# Patient Record
Sex: Female | Born: 1980 | Race: White | Hispanic: No | Marital: Married | Smoking: Current every day smoker
Health system: Southern US, Community
[De-identification: ages and names within clinical notes are randomized; demographics above are authoritative.]

## PROBLEM LIST (undated history)

## (undated) DIAGNOSIS — K219 Gastro-esophageal reflux disease without esophagitis: Secondary | ICD-10-CM

## (undated) DIAGNOSIS — Z8489 Family history of other specified conditions: Secondary | ICD-10-CM

## (undated) DIAGNOSIS — Q796 Ehlers-Danlos syndrome, unspecified: Secondary | ICD-10-CM

## (undated) DIAGNOSIS — R519 Headache, unspecified: Secondary | ICD-10-CM

## (undated) DIAGNOSIS — F32A Depression, unspecified: Secondary | ICD-10-CM

## (undated) DIAGNOSIS — F329 Major depressive disorder, single episode, unspecified: Secondary | ICD-10-CM

## (undated) DIAGNOSIS — M1990 Unspecified osteoarthritis, unspecified site: Secondary | ICD-10-CM

## (undated) DIAGNOSIS — F419 Anxiety disorder, unspecified: Secondary | ICD-10-CM

## (undated) DIAGNOSIS — F319 Bipolar disorder, unspecified: Secondary | ICD-10-CM

## (undated) HISTORY — PX: EYE SURGERY: SHX253

## (undated) HISTORY — PX: OTHER SURGICAL HISTORY: SHX169

## (undated) HISTORY — PX: BILATERAL SALPINGECTOMY: SHX5743

## (undated) HISTORY — PX: BUNIONECTOMY: SHX129

## (undated) HISTORY — PX: ANKLE ARTHROSCOPY W/ OPEN REPAIR: SHX1145

## (undated) HISTORY — PX: WISDOM TOOTH EXTRACTION: SHX21

## (undated) HISTORY — PX: BREAST ENHANCEMENT SURGERY: SHX7

## (undated) HISTORY — PX: BACK SURGERY: SHX140

---
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
## 2001-11-21 ENCOUNTER — Encounter: Admission: RE | Admit: 2001-11-21 | Discharge: 2001-11-21 | Payer: Self-pay | Admitting: *Deleted

## 2001-11-23 ENCOUNTER — Encounter: Admission: RE | Admit: 2001-11-23 | Discharge: 2001-11-23 | Payer: Self-pay | Admitting: Internal Medicine

## 2001-11-27 ENCOUNTER — Encounter: Admission: RE | Admit: 2001-11-27 | Discharge: 2001-11-27 | Payer: Self-pay | Admitting: *Deleted

## 2001-11-28 ENCOUNTER — Other Ambulatory Visit: Admission: RE | Admit: 2001-11-28 | Discharge: 2001-11-28 | Payer: Self-pay | Admitting: Obstetrics & Gynecology

## 2001-11-28 ENCOUNTER — Encounter: Admission: RE | Admit: 2001-11-28 | Discharge: 2001-11-28 | Payer: Self-pay | Admitting: *Deleted

## 2001-11-28 ENCOUNTER — Encounter (INDEPENDENT_AMBULATORY_CARE_PROVIDER_SITE_OTHER): Payer: Self-pay | Admitting: Specialist

## 2009-03-13 ENCOUNTER — Ambulatory Visit (HOSPITAL_COMMUNITY): Admission: RE | Admit: 2009-03-13 | Discharge: 2009-03-13 | Payer: Self-pay | Admitting: Orthopedic Surgery

## 2009-10-01 ENCOUNTER — Encounter: Admission: RE | Admit: 2009-10-01 | Discharge: 2009-10-01 | Payer: Self-pay

## 2019-11-18 ENCOUNTER — Other Ambulatory Visit: Payer: Self-pay

## 2019-11-18 ENCOUNTER — Emergency Department (HOSPITAL_COMMUNITY)

## 2019-11-18 ENCOUNTER — Emergency Department (HOSPITAL_COMMUNITY)
Admission: EM | Admit: 2019-11-18 | Discharge: 2019-11-19 | Disposition: A | Discharge status: Home / Self Care | Attending: Emergency Medicine | Admitting: Emergency Medicine

## 2019-11-18 DIAGNOSIS — W19XXXA Unspecified fall, initial encounter: Secondary | ICD-10-CM

## 2019-11-18 DIAGNOSIS — M25552 Pain in left hip: Secondary | ICD-10-CM | POA: Diagnosis present

## 2019-11-18 DIAGNOSIS — W1830XA Fall on same level, unspecified, initial encounter: Secondary | ICD-10-CM | POA: Diagnosis not present

## 2019-11-18 DIAGNOSIS — G8921 Chronic pain due to trauma: Secondary | ICD-10-CM | POA: Insufficient documentation

## 2019-11-18 DIAGNOSIS — G8929 Other chronic pain: Secondary | ICD-10-CM

## 2019-11-18 DIAGNOSIS — Z79899 Other long term (current) drug therapy: Secondary | ICD-10-CM | POA: Insufficient documentation

## 2019-11-18 DIAGNOSIS — M5442 Lumbago with sciatica, left side: Secondary | ICD-10-CM | POA: Diagnosis not present

## 2019-11-18 LAB — I-STAT BETA HCG BLOOD, ED (MC, WL, AP ONLY): I-stat hCG, quantitative: <5 m[IU]/mL (ref <5)

## 2019-11-18 MED ORDER — ONDANSETRON HCL 4 MG/2ML IJ SOLN
4.0000 mg | Freq: Once | INTRAMUSCULAR | Status: AC
  Administered 2019-11-18: 4 mg via INTRAVENOUS
  Filled 2019-11-18: qty 2

## 2019-11-18 MED ORDER — SODIUM CHLORIDE 0.9 % IV BOLUS
1000.0000 mL | Freq: Once | INTRAVENOUS | Status: AC
  Administered 2019-11-19: 1000 mL via INTRAVENOUS

## 2019-11-18 MED ORDER — HYDROMORPHONE HCL 1 MG/ML IJ SOLN
1.0000 mg | Freq: Once | INTRAMUSCULAR | Status: AC
  Administered 2019-11-18: 1 mg via INTRAVENOUS
  Filled 2019-11-18: qty 1

## 2019-11-18 MED ORDER — LORAZEPAM 2 MG/ML IJ SOLN
1.0000 mg | Freq: Once | INTRAMUSCULAR | Status: AC
  Administered 2019-11-18: 1 mg via INTRAVENOUS
  Filled 2019-11-18: qty 1

## 2019-11-18 NOTE — ED Triage Notes (Signed)
Patient c/o left hip pain from fall two weeks ago. Goes to a pain clinic and did an xray/MRI of her back, but states she is still having excruciating pain in her left hip.

## 2019-11-18 NOTE — ED Provider Notes (Signed)
[facility] EMERGENCY DEPARTMENT Provider Note   CSN: [id number] Arrival date & time: 11/18/19  1855    History Chief Complaint  Patient presents with  . Hip Pain    Terri Patterson is a 39 y.o. female with medical history significant for chronic back pain who presents for evaluation of left hip pain.  She has sciatica.  Had fall 2 weeks ago and since then has had left hip pain.  She was seen by her pain management who prescribed her oxycodone, gabapentin as well as "pain patches."  Rates her pain a 10/10.  She feels like she has numbness to her left lower extremity.  Had MRI of her back on Friday at [facility] which was "negative".  I do not see records of this in epic.  Was also seen at [facility] in [place] on 4/17 with similar symptoms.  Had negative plain films of her lumbar and left hip.  Patient states her pain is worsening.  She denies fever, chills, nausea, vomiting, chest pain, shortness of breath abdominal pain, pelvic pain, flank pain, vaginal discharge, bowel or bladder incontinence, saddle paresthesias.  Denies any redness, swelling, warmth to extremities.  Denies additional aggravating or alleviating factors.  Apparently she is followed with her pain clinic and they told her they would not prescribe her any additional pain medicine.  Is also given course of steroids which have not helped.  Have a nerve conduction test outpatient which patient states was "normal."  Does have chronic back pain however this does not feel similar.  Denies any urinary symptoms or weakness.  Denies additional aggravating or alleviating factors.  History obtained from patient and past medical records.  No interpreter is used.  HPI     No past medical history on file.  There are no problems to display for this patient.  History reviewed.   OB History   No obstetric history on file.     No family history on file.  Social History   Tobacco Use  . Smoking status: Not  on file  Substance Use Topics  . Alcohol use: Not on file  . Drug use: Not on file    Home Medications Prior to Admission medications   Medication Sig Start Date End Date Taking? Authorizing Provider  acetaminophen (TYLENOL) 500 MG tablet Take 2,000 mg by mouth daily as needed (pain).   Yes [provider]  Buprenorphine 7.5 MCG/HR PTWK Apply 1 patch topically every Friday. 11/14/19  Yes [provider]  busPIRone (BUSPAR) 10 MG tablet Take 20 mg by mouth 3 (three) times daily. 10/08/19  Yes [provider]  diphenhydrAMINE (BENADRYL) 25 MG tablet Take 25-50 mg by mouth See admin instructions. Take 1 tablet (25 mg) by mouth every morning and 2 tablets (50 mg) at night   Yes [provider]  esomeprazole (NEXIUM) 20 MG capsule Take 40 mg by mouth daily as needed (acid reflux/indigestion).   Yes [provider]  lamoTRIgine (LAMICTAL) 25 MG tablet Take 125 mg by mouth at bedtime. 05/31/19  Yes [provider]  naproxen sodium (ALEVE) 220 MG tablet Take 440 mg by mouth 2 (two) times daily as needed (pain).   Yes [provider]  oxyCODONE-acetaminophen (PERCOCET/ROXICET) 5-325 MG tablet Take 1 tablet by mouth every 6 (six) hours as needed (pain).  10/24/19   [provider]  predniSONE (DELTASONE) 20 MG tablet Take 20 mg by mouth 2 (two) times daily. 11/11/19   [provider]  Allergies    Penicillins  Review of Systems   Review of Systems  Constitutional: Negative.   HENT: Negative.   Respiratory: Negative.   Cardiovascular: Negative.   Gastrointestinal: Negative.   Genitourinary: Negative.  Negative for difficulty urinating.  Musculoskeletal: Positive for gait problem. Negative for arthralgias, neck pain and neck stiffness.       Left hip pain  Skin: Negative.   All other systems reviewed and are negative.   Physical Exam Updated Vital Signs BP (!) 97/39   Pulse (!) 110   Temp 98.5 F (36.9 C)  (Oral)   Resp (!) 24   SpO2 97%   Physical Exam Vitals and nursing note reviewed.  Constitutional:      General: She is not in acute distress.    Appearance: She is well-developed. She is not ill-appearing, toxic-appearing or diaphoretic.     Comments: Rocking back and forth in pain, screaming  HENT:     Head: Normocephalic and atraumatic.     Nose: Nose normal.     Mouth/Throat:     Mouth: Mucous membranes are moist.     Pharynx: Oropharynx is clear.  Eyes:     Pupils: Pupils are equal, round, and reactive to light.  Cardiovascular:     Rate and Rhythm: Tachycardia present.     Pulses: Normal pulses.     Heart sounds: Normal heart sounds.  Pulmonary:     Effort: Pulmonary effort is normal. No respiratory distress.     Breath sounds: Normal breath sounds.  Abdominal:     General: Bowel sounds are normal. There is no distension.     Tenderness: There is no abdominal tenderness. There is no right CVA tenderness, left CVA tenderness, guarding or rebound.  Musculoskeletal:        General: Tenderness present. No swelling, deformity or signs of injury.     Cervical back: Normal and normal range of motion.     Thoracic back: Normal.     Lumbar back: Tenderness present.       Back:     Right hip: Normal.     Left hip: Tenderness present. No deformity or lacerations. Decreased range of motion.     Right upper leg: Normal.     Left upper leg: Normal.     Right knee: Normal.     Left knee: Normal.     Right lower leg: Normal. No edema.     Left lower leg: Normal. No edema.     Right ankle: Normal.     Left ankle: Normal.       Legs:     Comments: Pelvis stable, nontender palpation.  Diffuse tenderness to her left hip however worse to her piriformis.  Patient unwilling to perform range of motion due to pain.  No edema, erythema or warmth.  No obvious swelling.  No shortening or rotation of legs.  Skin:    General: Skin is warm and dry.  Neurological:     Mental Status: She is  alert.     Comments: Subjective numbness to her left lateral thigh.  Will strengthen bilateral feet.     ED Results / Procedures / Treatments   Labs (all labs ordered are listed, but only abnormal results are displayed) Labs Reviewed  I-STAT BETA HCG BLOOD, ED ([facility], [facility], [facility] ONLY)    EKG None  Radiology DG Hip Unilat With Pelvis 2-3 Views Left  Result Date: 11/18/2019 CLINICAL DATA:  Left hip pain from fall 2  weeks ago EXAM: DG HIP (WITH OR WITHOUT PELVIS) 2-3V LEFT COMPARISON:  None. FINDINGS: Frontal view of the pelvis as well as frontal and frogleg lateral views of the left hip are obtained. No fracture, subluxation, or dislocation. Joint spaces are well preserved. Soft tissues are normal. IMPRESSION: 1. No acute bony abnormality. Electronically Signed   By: Sharlet Salina M.D.   On: 11/18/2019 20:30   Acute Interface, Incoming Rad Results - 11/10/2019 6:36 PM EDT  INDICATION: Sciatica  COMPARISON: None  TECHNIQUE: 3 views lumbar spine  FINDINGS: 5 lumbar type nonrib and vertebral bodies. Mild degenerative disc disease at L5-S1. No acute fractures or subluxations. No compression deformities. Mild facet degenerative changes at L5-S1. Unremarkable paraspinal soft tissues.   IMPRESSION:  Mild degenerative changes at L5-S1. No acute process.   Procedures Procedures (including critical care time)  Medications Ordered in ED Medications  sodium chloride 0.9 % bolus 1,000 mL (has no administration in time range)  HYDROmorphone (DILAUDID) injection 1 mg (1 mg Intravenous Given 11/18/19 2209)  ondansetron (ZOFRAN) injection 4 mg (4 mg Intravenous Given 11/18/19 2205)  LORazepam (ATIVAN) injection 1 mg (1 mg Intravenous Given 11/18/19 2206)   ED Course  I have reviewed the triage vital signs and the nursing notes.  Pertinent labs & imaging results that were available during my care of the patient were reviewed by me and considered in my medical decision making (see chart for  details).  65 old female presents for evaluation of left hip pain.  She is afebrile, nonseptic, not ill-appearing.  Is being followed by pain management for this.  Had negative plain film x-ray, lumbar as well as MRI on Friday which did not show any significant findings.  I am unable to view her MRI records.  She has been provided with pain management outpatient when she called her pain management provider these told her they were unable to provide her additional medicine.  Patient without any infectious symptoms.  She does have severe tenderness to her left piriformis and hip.  She is unwilling to perform ROM to her left hip due to her pain.  States this all began after a "fall."  She has no overlying skin changes.  She is neurovascularly intact.  Does have some subjective numbness without any weakness to this area.  No history of IV drug use, bowel or bladder incontinence, saddle paresthesias.  Patient with continued pain to her left hip after fall will obtain CT to assess for occult fracture.  Plan on providing pain management.   Prior records reviewed.  Symptoms seem consistent with her prior ED visits, with lower back pain radiating into her left leg with paresthesias. Question acut eon chronic pain. MRI from July 2020 shows Dr. Mayford Knife  [facility] report obtained, date January 24, 2019 shows large right paracentral disc protrusion identified at L5-S1 which is unchanged since December 27, 2018 there is no enhancement identified to suggest scar. This large disc protrusion is compressing on the traversing right S1 nerve root.   Reassess after pain management.  States her pain is currently a 3/10.  She has bilateral legs extended and appears comfortable.  She moves bilateral lower extremities without difficulty and flexes at the hips.  She does have some low blood pressure personally reassessed at 98/60 likely related to her laying down as well as pain medicine and sleepyness.  Do have 1 L IV fluids ordered.   Low suspicion for sepsis.  We will plan to recheck.  X-ray left hip with pelvis negative for acute fracture. Pregnancy test negative   Care transferred to [facility] at sift change. If CT negative, likely dc home to follow back up with her pain specialist. Can dc home with resources for Ortho if would like second opinion.     MDM Rules/Calculators/A&P                       Final Clinical Impression(s) / ED Diagnoses Final diagnoses:  Fall, initial encounter  Acute pain of left hip  Chronic left-sided low back pain with left-sided sciatica    Rx / DC Orders ED Discharge Orders    None       Nihira Puello A, PA-C 11/18/19 2328    Elnora Morrison, MD 11/18/19 2356

## 2019-11-18 NOTE — ED Provider Notes (Signed)
Received patient at signout from [facility].  Refer to provider note for full history and physical examination.  Briefly, patient is a 39 year old female with history of chronic back pain presenting for evaluation of left hip pain.  She had a fall 2 weeks ago and has had persistent hip pain.  Reportedly had an MRI of the lumbar spine recently which was normal but unable to view report here.  Pending CT hip to rule out occult fracture or other underlying processes.  She is interested in follow-up with orthopedics on an outpatient basis for second opinion.  Physical Exam  BP 120/80   Pulse 96   Temp 97.7 F (36.5 C) (Oral)   Resp 13   SpO2 97%   Physical Exam Vitals and nursing note reviewed.  Constitutional:      General: She is not in acute distress.    Appearance: She is well-developed.     Comments: Appears mildly uncomfortable  HENT:     Head: Normocephalic and atraumatic.  Eyes:     General:        Right eye: No discharge.        Left eye: No discharge.     Conjunctiva/sclera: Conjunctivae normal.  Neck:     Vascular: No JVD.     Trachea: No tracheal deviation.  Cardiovascular:     Rate and Rhythm: Normal rate.  Pulmonary:     Effort: Pulmonary effort is normal.  Abdominal:     General: There is no distension.  Musculoskeletal:     Comments: Moving extremities spontaneously without difficulty  Skin:    General: Skin is warm and dry.     Findings: No erythema.  Neurological:     Mental Status: She is alert.  Psychiatric:        Behavior: Behavior normal.     ED Course/Procedures     Procedures  MDM  CT Hip Left Wo Contrast  Result Date: 11/18/2019 CLINICAL DATA:  39 year old female with fall and left hip pain. Concern for fracture. EXAM: CT OF THE LEFT HIP WITHOUT CONTRAST TECHNIQUE: Multidetector CT imaging of the left hip was performed according to the standard protocol. Multiplanar CT image reconstructions were also generated. COMPARISON:  Left hip  radiograph dated 11/18/2019. FINDINGS: Bones/Joint/Cartilage There is no acute fracture or dislocation. The bones are well mineralized parental no arthritic changes. No joint effusion. Ligaments Suboptimally assessed by CT. Muscles and Tendons No acute findings. No fluid collection or hematoma. Soft tissues The visualized soft tissues are grossly unremarkable. IMPRESSION: No acute/traumatic left hip pathology. Electronically Signed   By: Anner Crete M.D.   On: 11/18/2019 23:45   DG Hip Unilat With Pelvis 2-3 Views Left  Result Date: 11/18/2019 CLINICAL DATA:  Left hip pain from fall 2 weeks ago EXAM: DG HIP (WITH OR WITHOUT PELVIS) 2-3V LEFT COMPARISON:  None. FINDINGS: Frontal view of the pelvis as well as frontal and frogleg lateral views of the left hip are obtained. No fracture, subluxation, or dislocation. Joint spaces are well preserved. Soft tissues are normal. IMPRESSION: 1. No acute bony abnormality. Electronically Signed   By: Randa Ngo M.D.   On: 11/18/2019 20:30   Imaging today shows no evidence of acute traumatic left hip pathology.  No concern for underlying infection.  Her vital signs have improved significantly.  Reports that pain improved while in the ED.  She expresses some frustration with her pain management physician and states "he told me I have conversion syndrome and thinks  that I am just not right in the head".  She expresses desire to follow-up with a different pain management provider, will give outpatient resource packet for follow-up.  We also discussed conservative therapy and management at home with her prescribed pain medications, gentle stretching, Flexeril.  Advised of potential side effects and appropriate use of medications.  We will also give her information for follow-up with orthopedics on call on an outpatient basis.  Discussed strict ED return precautions.  Patient verbalized understanding of and agreement with plan and is stable for discharge at this  time.       Jeanie Sewer, PA-C 11/19/19 0117    Ward, Layla Maw, DO 11/19/19 [phone]

## 2019-11-18 NOTE — Discharge Instructions (Addendum)
1. Medications: Alternate 600 mg of ibuprofen and [phone] mg of Tylenol every 3 hours as needed for pain. Do not exceed 4000 mg of Tylenol daily.  Take ibuprofen with food to avoid upset stomach issues.  You can take cyclobenzaprine as needed for muscle spasm up to twice daily but do not drive, drink alcohol, or operate heavy machinery while taking this medicine because it may make you drowsy.  I typically recommend taking this medicine only at night when you are going to sleep.  You can also cut these tablets in half if they make you feel very drowsy. 2. Treatment: rest, drink plenty of fluids, gentle stretching as discussed (see attached), alternate ice and heat (or stick with whichever feels best) 20 minutes on 20 minutes off. 3. Follow Up: Please followup with your primary doctor, orthopedist, or pain management provider in 3-7 days for discussion of your diagnoses and further evaluation after today's visit; please see attached information for pain management resources in the area;  Return to the ER for worsening back pain, difficulty walking, loss of bowel or bladder control or other concerning symptoms

## 2019-11-19 DIAGNOSIS — M25552 Pain in left hip: Secondary | ICD-10-CM | POA: Diagnosis not present

## 2019-11-19 MED ORDER — KETOROLAC TROMETHAMINE 30 MG/ML IJ SOLN
30.0000 mg | Freq: Once | INTRAMUSCULAR | Status: AC
  Administered 2019-11-19: 30 mg via INTRAVENOUS
  Filled 2019-11-19: qty 1

## 2019-11-19 MED ORDER — CYCLOBENZAPRINE HCL 10 MG PO TABS
10.0000 mg | ORAL_TABLET | Freq: Once | ORAL | Status: AC
  Administered 2019-11-19: 10 mg via ORAL
  Filled 2019-11-19: qty 1

## 2019-11-19 MED ORDER — CYCLOBENZAPRINE HCL 10 MG PO TABS: 10.0000 mg | ORAL_TABLET | Freq: Two times a day (BID) | ORAL | 0 refills | Status: AC | PRN

## 2019-12-10 ENCOUNTER — Other Ambulatory Visit: Payer: Self-pay | Admitting: Neurological Surgery

## 2019-12-20 ENCOUNTER — Encounter (HOSPITAL_COMMUNITY): Payer: Self-pay

## 2019-12-20 NOTE — Progress Notes (Signed)
[facility] [address] [place] - [address] [facility] [place] [address] Phone: [phone] Fax: [phone]      Your procedure is scheduled on Tuesday, 12/25/19.  Report to [facility] Main Entrance "A" at 9:15 A.M., and check in at the Admitting office.  Call this number if you have problems the morning of surgery:  [phone]  Call [phone] if you have any questions prior to your surgery date Monday-Friday 8am-4pm    Remember:  Do not eat or drink after midnight the night before your surgery - Monday    Take these medicines the morning of surgery with A SIP OF WATER: busPIRone (BUSPAR)  pregabalin (LYRICA) 75   If Needed:  acetaminophen (TYLENOL)  cyclobenzaprine (FLEXERIL) esomeprazole (NEXIUM)   As of today, STOP taking any Aspirin (unless otherwise instructed by your surgeon) and Aspirin containing products, Aleve, Naproxen, Ibuprofen, Motrin, Advil, Goody's, BC's, all herbal medications, fish oil, and all vitamins.                      Do not wear jewelry, make up, or nail polish            Do not wear lotions, powders, perfumes, or deodorant.            Do not shave 48 hours prior to surgery.             Do not bring valuables to the hospital.            [facility] is not responsible for any belongings or valuables.  Do NOT Smoke (Tobacco/Vapping) or drink Alcohol 24 hours prior to your procedure If you use a CPAP at night, you may bring all equipment for your overnight stay.   Contacts, glasses, dentures or bridgework may not be worn into surgery.      For patients admitted to the hospital, discharge time will be determined by your treatment team.   Patients discharged the day of surgery will not be allowed to drive home, and someone needs to stay with them for 24 hours.    Special instructions:   [facility]- Preparing For Surgery  Before surgery, you can play an important role. Because skin is not sterile, your skin  needs to be as free of germs as possible. You can reduce the number of germs on your skin by washing with CHG (chlorahexidine gluconate) Soap before surgery.  CHG is an antiseptic cleaner which kills germs and bonds with the skin to continue killing germs even after washing.    Oral Hygiene is also important to reduce your risk of infection.  Remember - BRUSH YOUR TEETH THE MORNING OF SURGERY WITH YOUR REGULAR TOOTHPASTE  Please do not use if you have an allergy to CHG or antibacterial soaps. If your skin becomes reddened/irritated stop using the CHG.  Do not shave (including legs and underarms) for at least 48 hours prior to first CHG shower. It is OK to shave your face.  Please follow these instructions carefully.   1. Shower the [employer] BEFORE SURGERY (Mon) and the MORNING OF SURGERY (Tues) with CHG Soap.   2. If you chose to wash your hair, wash your hair first as usual with your normal shampoo.  3. After you shampoo, rinse your hair and body thoroughly to remove the shampoo.  4. Use CHG as you would any other liquid soap. You can apply CHG directly to the skin and wash gently with  a scrungie or a clean washcloth.   5. Apply the CHG Soap to your body ONLY FROM THE NECK DOWN.  Do not use on open wounds or open sores. Avoid contact with your eyes, ears, mouth and genitals (private parts). Wash Face and genitals (private parts)  with your normal soap.   6. Wash thoroughly, paying special attention to the area where your surgery will be performed.  7. Thoroughly rinse your body with warm water from the neck down.  8. DO NOT shower/wash with your normal soap after using and rinsing off the CHG Soap.  9. Pat yourself dry with a CLEAN TOWEL.  10. Wear CLEAN PAJAMAS to bed the night before surgery, wear comfortable clothes the morning of surgery  11. Place CLEAN SHEETS on your bed the night of your first shower and DO NOT SLEEP WITH PETS.   Day of Surgery:   Do not apply any  deodorants/lotions.  Please wear clean clothes to the hospital/surgery center.   Remember to brush your teeth WITH YOUR REGULAR TOOTHPASTE.   Please read over the following fact sheets that you were given.

## 2019-12-21 ENCOUNTER — Encounter (HOSPITAL_COMMUNITY): Payer: Self-pay

## 2019-12-21 ENCOUNTER — Other Ambulatory Visit (HOSPITAL_COMMUNITY)
Admission: RE | Admit: 2019-12-21 | Discharge: 2019-12-21 | Disposition: A | Discharge status: Home / Self Care | Source: Ambulatory Visit | Attending: Neurological Surgery | Admitting: Neurological Surgery

## 2019-12-21 ENCOUNTER — Encounter (HOSPITAL_COMMUNITY)
Admission: RE | Admit: 2019-12-21 | Discharge: 2019-12-21 | Disposition: A | Discharge status: Home / Self Care | Source: Ambulatory Visit | Attending: Neurological Surgery | Admitting: Neurological Surgery

## 2019-12-21 ENCOUNTER — Other Ambulatory Visit: Payer: Self-pay

## 2019-12-21 DIAGNOSIS — Z01812 Encounter for preprocedural laboratory examination: Secondary | ICD-10-CM | POA: Insufficient documentation

## 2019-12-21 DIAGNOSIS — Z20822 Contact with and (suspected) exposure to covid-19: Secondary | ICD-10-CM | POA: Insufficient documentation

## 2019-12-21 HISTORY — DX: Ehlers-Danlos syndrome, unspecified: Q79.60

## 2019-12-21 HISTORY — DX: Headache, unspecified: R51.9

## 2019-12-21 HISTORY — DX: Anxiety disorder, unspecified: F41.9

## 2019-12-21 HISTORY — DX: Gastro-esophageal reflux disease without esophagitis: K21.9

## 2019-12-21 HISTORY — DX: Bipolar disorder, unspecified: F31.9

## 2019-12-21 HISTORY — DX: Depression, unspecified: F32.A

## 2019-12-21 HISTORY — DX: Unspecified osteoarthritis, unspecified site: M19.90

## 2019-12-21 HISTORY — DX: Family history of other specified conditions: Z84.89

## 2019-12-21 LAB — CBC
HCT: 39.3 % (ref 36.0–46.0)
Hemoglobin: 12.1 g/dL (ref 12.0–15.0)
MCH: 28.1 pg (ref 26.0–34.0)
MCHC: 30.8 g/dL (ref 30.0–36.0)
MCV: 91.4 fL (ref 80.0–100.0)
Platelets: 284 10*3/uL (ref 150–400)
RBC: 4.3 MIL/uL (ref 3.87–5.11)
RDW: 14.3 % (ref 11.5–15.5)
WBC: 7.7 10*3/uL (ref 4.0–10.5)
nRBC: 0 % (ref 0.0–0.2)

## 2019-12-21 LAB — BASIC METABOLIC PANEL
Anion gap: 7 (ref 5–15)
BUN: 8 mg/dL (ref 6–20)
CO2: 25 mmol/L (ref 22–32)
Calcium: 8.7 mg/dL — ABNORMAL LOW (ref 8.9–10.3)
Chloride: 110 mmol/L (ref 98–111)
Creatinine, Ser: 0.5 mg/dL (ref 0.44–1.00)
GFR calc Af Amer: >60 mL/min (ref >60)
GFR calc non Af Amer: >60 mL/min (ref >60)
Glucose, Bld: 103 mg/dL — ABNORMAL HIGH (ref 70–99)
Potassium: 4.1 mmol/L (ref 3.5–5.1)
Sodium: 142 mmol/L (ref 135–145)

## 2019-12-21 LAB — SURGICAL PCR SCREEN
MRSA, PCR: NEGATIVE
Staphylococcus aureus: NEGATIVE

## 2019-12-21 LAB — TYPE AND SCREEN
ABO/RH(D): B POS
Antibody Screen: NEGATIVE

## 2019-12-21 LAB — ABO/RH: ABO/RH(D): B POS

## 2019-12-21 LAB — SARS CORONAVIRUS 2 (TAT 6-24 HRS): SARS Coronavirus 2: NEGATIVE

## 2019-12-21 NOTE — Progress Notes (Signed)
PCP - [facility] Cardiologist -   PPM/ICD -  Device Orders -  Rep Notified -   Chest x-ray - n/a EKG - n/a Stress Test - patient denies ECHO - patient denies Cardiac Cath - patient denies  Sleep Study - patient denies CPAP -   Fasting Blood Sugar -  Checks Blood Sugar _____ times a day  Blood Thinner Instructions: Aspirin Instructions:  ERAS Protcol - n/a PRE-SURGERY Ensure or G2-   COVID TEST- 12/21/19 after PAT appointment   Anesthesia review: n/a   Patient denies shortness of breath, fever, cough and chest pain at PAT appointment   All instructions explained to the patient, with a verbal understanding of the material. Patient agrees to go over the instructions while at home for a better understanding. Patient also instructed to self quarantine after being tested for COVID-19. The opportunity to ask questions was provided.

## 2019-12-25 ENCOUNTER — Encounter (HOSPITAL_COMMUNITY): Payer: Self-pay | Admitting: Neurological Surgery

## 2019-12-25 ENCOUNTER — Inpatient Hospital Stay (HOSPITAL_COMMUNITY)

## 2019-12-25 ENCOUNTER — Inpatient Hospital Stay (HOSPITAL_COMMUNITY): Admitting: Anesthesiology

## 2019-12-25 ENCOUNTER — Inpatient Hospital Stay (HOSPITAL_COMMUNITY)
Admission: RE | Admit: 2019-12-25 | Discharge: 2019-12-26 | DRG: 460 | Disposition: A | Discharge status: Home / Self Care | Source: Ambulatory Visit | Attending: Neurological Surgery | Admitting: Neurological Surgery

## 2019-12-25 ENCOUNTER — Encounter (HOSPITAL_COMMUNITY)
Admission: RE | Disposition: A | Discharge status: Home / Self Care | Payer: Self-pay | Source: Ambulatory Visit | Attending: Neurological Surgery

## 2019-12-25 ENCOUNTER — Other Ambulatory Visit: Payer: Self-pay

## 2019-12-25 DIAGNOSIS — F17210 Nicotine dependence, cigarettes, uncomplicated: Secondary | ICD-10-CM | POA: Diagnosis present

## 2019-12-25 DIAGNOSIS — Z419 Encounter for procedure for purposes other than remedying health state, unspecified: Secondary | ICD-10-CM

## 2019-12-25 DIAGNOSIS — K219 Gastro-esophageal reflux disease without esophagitis: Secondary | ICD-10-CM | POA: Diagnosis present

## 2019-12-25 DIAGNOSIS — M5416 Radiculopathy, lumbar region: Secondary | ICD-10-CM | POA: Diagnosis present

## 2019-12-25 DIAGNOSIS — Q796 Ehlers-Danlos syndrome, unspecified: Secondary | ICD-10-CM

## 2019-12-25 DIAGNOSIS — F319 Bipolar disorder, unspecified: Secondary | ICD-10-CM | POA: Diagnosis present

## 2019-12-25 DIAGNOSIS — M1990 Unspecified osteoarthritis, unspecified site: Secondary | ICD-10-CM | POA: Diagnosis present

## 2019-12-25 HISTORY — PX: TRANSFORAMINAL LUMBAR INTERBODY FUSION (TLIF) WITH PEDICLE SCREW FIXATION 1 LEVEL: SHX6141

## 2019-12-25 LAB — POCT PREGNANCY, URINE: Preg Test, Ur: NEGATIVE

## 2019-12-25 SURGERY — TRANSFORAMINAL LUMBAR INTERBODY FUSION (TLIF) WITH PEDICLE SCREW FIXATION 1 LEVEL
Anesthesia: General | Laterality: Left

## 2019-12-25 MED ORDER — LIDOCAINE-EPINEPHRINE 1 %-1:100000 IJ SOLN
INTRAMUSCULAR | Status: AC
  Filled 2019-12-25: qty 1

## 2019-12-25 MED ORDER — ROCURONIUM BROMIDE 10 MG/ML (PF) SYRINGE
PREFILLED_SYRINGE | INTRAVENOUS | Status: AC
  Filled 2019-12-25: qty 10

## 2019-12-25 MED ORDER — ONDANSETRON HCL 4 MG/2ML IJ SOLN
INTRAMUSCULAR | Status: AC
  Filled 2019-12-25: qty 2

## 2019-12-25 MED ORDER — ROCURONIUM BROMIDE 10 MG/ML (PF) SYRINGE
PREFILLED_SYRINGE | INTRAVENOUS | Status: DC | PRN
  Administered 2019-12-25 (×2): 30 mg via INTRAVENOUS
  Administered 2019-12-25: 60 mg via INTRAVENOUS
  Administered 2019-12-25: 40 mg via INTRAVENOUS

## 2019-12-25 MED ORDER — DEXAMETHASONE SODIUM PHOSPHATE 10 MG/ML IJ SOLN
INTRAMUSCULAR | Status: AC
  Filled 2019-12-25: qty 1

## 2019-12-25 MED ORDER — ACETAMINOPHEN 10 MG/ML IV SOLN
INTRAVENOUS | Status: AC
  Filled 2019-12-25: qty 100

## 2019-12-25 MED ORDER — 0.9 % SODIUM CHLORIDE (POUR BTL) OPTIME
TOPICAL | Status: DC | PRN
  Administered 2019-12-25: 1000 mL

## 2019-12-25 MED ORDER — HYDROMORPHONE HCL 1 MG/ML IJ SOLN: 1.0000 mg | INTRAMUSCULAR | Status: DC | PRN

## 2019-12-25 MED ORDER — KETAMINE HCL 10 MG/ML IJ SOLN
INTRAMUSCULAR | Status: DC | PRN
Start: 2019-12-25 — End: 2019-12-25
  Administered 2019-12-25: 10 mg via INTRAVENOUS
  Administered 2019-12-25: 30 mg via INTRAVENOUS

## 2019-12-25 MED ORDER — BUPIVACAINE HCL (PF) 0.5 % IJ SOLN
INTRAMUSCULAR | Status: DC | PRN
  Administered 2019-12-25: 5 mL

## 2019-12-25 MED ORDER — PHENYLEPHRINE 40 MCG/ML (10ML) SYRINGE FOR IV PUSH (FOR BLOOD PRESSURE SUPPORT)
PREFILLED_SYRINGE | INTRAVENOUS | Status: DC | PRN
  Administered 2019-12-25: 120 ug via INTRAVENOUS
  Administered 2019-12-25: 80 ug via INTRAVENOUS
  Administered 2019-12-25: 40 ug via INTRAVENOUS
  Administered 2019-12-25 (×2): 80 ug via INTRAVENOUS

## 2019-12-25 MED ORDER — MIDAZOLAM HCL 2 MG/2ML IJ SOLN
INTRAMUSCULAR | Status: AC
  Filled 2019-12-25: qty 2

## 2019-12-25 MED ORDER — ONDANSETRON HCL 4 MG/2ML IJ SOLN
INTRAMUSCULAR | Status: DC | PRN
  Administered 2019-12-25: 4 mg via INTRAVENOUS

## 2019-12-25 MED ORDER — SODIUM CHLORIDE 0.9 % IV SOLN
INTRAVENOUS | Status: DC | PRN
  Administered 2019-12-25: 500 mL

## 2019-12-25 MED ORDER — ACETAMINOPHEN 10 MG/ML IV SOLN
INTRAVENOUS | Status: DC | PRN
Start: 2019-12-25 — End: 2019-12-25
  Administered 2019-12-25: 1000 mg via INTRAVENOUS

## 2019-12-25 MED ORDER — BUSPIRONE HCL 10 MG PO TABS
20.0000 mg | ORAL_TABLET | Freq: Three times a day (TID) | ORAL | Status: DC
  Administered 2019-12-25 – 2019-12-26 (×2): 20 mg via ORAL
  Filled 2019-12-25 (×2): qty 2

## 2019-12-25 MED ORDER — POLYETHYLENE GLYCOL 3350 17 G PO PACK: 17.0000 g | PACK | Freq: Every day | ORAL | Status: DC | PRN

## 2019-12-25 MED ORDER — PROMETHAZINE HCL 25 MG/ML IJ SOLN: 6.2500 mg | INTRAMUSCULAR | Status: DC | PRN

## 2019-12-25 MED ORDER — DOCUSATE SODIUM 100 MG PO CAPS
100.0000 mg | ORAL_CAPSULE | Freq: Two times a day (BID) | ORAL | Status: DC
  Administered 2019-12-25 – 2019-12-26 (×2): 100 mg via ORAL
  Filled 2019-12-25 (×2): qty 1

## 2019-12-25 MED ORDER — MENTHOL 3 MG MT LOZG: 1.0000 | LOZENGE | OROMUCOSAL | Status: DC | PRN

## 2019-12-25 MED ORDER — HYDROMORPHONE HCL 1 MG/ML IJ SOLN
INTRAMUSCULAR | Status: AC
  Filled 2019-12-25: qty 1

## 2019-12-25 MED ORDER — THROMBIN 5000 UNITS EX SOLR
OROMUCOSAL | Status: DC | PRN
  Administered 2019-12-25: 5 mL via TOPICAL

## 2019-12-25 MED ORDER — KETAMINE HCL 50 MG/5ML IJ SOSY
PREFILLED_SYRINGE | INTRAMUSCULAR | Status: AC
  Filled 2019-12-25: qty 5

## 2019-12-25 MED ORDER — ACETAMINOPHEN 325 MG PO TABS
650.0000 mg | ORAL_TABLET | ORAL | Status: DC | PRN
  Administered 2019-12-25 – 2019-12-26 (×2): 650 mg via ORAL
  Filled 2019-12-25 (×2): qty 2

## 2019-12-25 MED ORDER — LIDOCAINE 2% (20 MG/ML) 5 ML SYRINGE
INTRAMUSCULAR | Status: AC
  Filled 2019-12-25: qty 5

## 2019-12-25 MED ORDER — CHLORHEXIDINE GLUCONATE CLOTH 2 % EX PADS: 6.0000 | MEDICATED_PAD | Freq: Once | CUTANEOUS | Status: DC

## 2019-12-25 MED ORDER — PANTOPRAZOLE SODIUM 40 MG PO TBEC
40.0000 mg | DELAYED_RELEASE_TABLET | Freq: Every day | ORAL | Status: DC
  Administered 2019-12-25 – 2019-12-26 (×2): 40 mg via ORAL
  Filled 2019-12-25 (×2): qty 1

## 2019-12-25 MED ORDER — CYCLOBENZAPRINE HCL 10 MG PO TABS
ORAL_TABLET | ORAL | Status: AC
  Filled 2019-12-25: qty 1

## 2019-12-25 MED ORDER — ORAL CARE MOUTH RINSE: 15.0000 mL | Freq: Once | OROMUCOSAL | Status: DC

## 2019-12-25 MED ORDER — PROMETHAZINE HCL 25 MG/ML IJ SOLN
INTRAMUSCULAR | Status: AC
  Filled 2019-12-25: qty 1

## 2019-12-25 MED ORDER — FENTANYL CITRATE (PF) 250 MCG/5ML IJ SOLN
INTRAMUSCULAR | Status: DC | PRN
  Administered 2019-12-25: 100 ug via INTRAVENOUS
  Administered 2019-12-25 (×3): 50 ug via INTRAVENOUS

## 2019-12-25 MED ORDER — PREGABALIN 75 MG PO CAPS
75.0000 mg | ORAL_CAPSULE | Freq: Two times a day (BID) | ORAL | Status: DC
  Administered 2019-12-25 – 2019-12-26 (×2): 75 mg via ORAL
  Filled 2019-12-25 (×2): qty 1

## 2019-12-25 MED ORDER — CHLORHEXIDINE GLUCONATE 0.12 % MT SOLN
15.0000 mL | Freq: Once | OROMUCOSAL | Status: DC
  Filled 2019-12-25: qty 15

## 2019-12-25 MED ORDER — OXYCODONE HCL 5 MG PO TABS: 5.0000 mg | ORAL_TABLET | ORAL | Status: DC | PRN

## 2019-12-25 MED ORDER — PHENOL 1.4 % MT LIQD: 1.0000 | OROMUCOSAL | Status: DC | PRN

## 2019-12-25 MED ORDER — LIDOCAINE-EPINEPHRINE 1 %-1:100000 IJ SOLN
INTRAMUSCULAR | Status: DC | PRN
  Administered 2019-12-25: 5 mL

## 2019-12-25 MED ORDER — DEXMEDETOMIDINE HCL IN NACL 80 MCG/20ML IV SOLN
INTRAVENOUS | Status: AC
  Filled 2019-12-25: qty 20

## 2019-12-25 MED ORDER — ORAL CARE MOUTH RINSE: 15.0000 mL | Freq: Once | OROMUCOSAL | Status: AC

## 2019-12-25 MED ORDER — CYCLOBENZAPRINE HCL 10 MG PO TABS
10.0000 mg | ORAL_TABLET | Freq: Three times a day (TID) | ORAL | Status: DC | PRN
  Administered 2019-12-25 – 2019-12-26 (×3): 10 mg via ORAL
  Filled 2019-12-25 (×2): qty 1

## 2019-12-25 MED ORDER — SODIUM CHLORIDE 0.9 % IV SOLN: 250.0000 mL | INTRAVENOUS | Status: DC

## 2019-12-25 MED ORDER — HYDROXYZINE HCL 10 MG PO TABS
10.0000 mg | ORAL_TABLET | Freq: Every day | ORAL | Status: DC
  Administered 2019-12-25: 10 mg via ORAL
  Filled 2019-12-25: qty 1

## 2019-12-25 MED ORDER — LIDOCAINE 2% (20 MG/ML) 5 ML SYRINGE
INTRAMUSCULAR | Status: DC | PRN
  Administered 2019-12-25: 80 mg via INTRAVENOUS

## 2019-12-25 MED ORDER — LACTATED RINGERS IV SOLN: INTRAVENOUS | Status: DC

## 2019-12-25 MED ORDER — VANCOMYCIN HCL IN DEXTROSE 1-5 GM/200ML-% IV SOLN
1000.0000 mg | Freq: Two times a day (BID) | INTRAVENOUS | Status: AC
  Administered 2019-12-25 – 2019-12-26 (×2): 1000 mg via INTRAVENOUS
  Filled 2019-12-25 (×2): qty 200

## 2019-12-25 MED ORDER — SODIUM CHLORIDE 0.9% FLUSH: 3.0000 mL | INTRAVENOUS | Status: DC | PRN

## 2019-12-25 MED ORDER — OXYCODONE HCL 5 MG PO TABS
10.0000 mg | ORAL_TABLET | ORAL | Status: DC | PRN
  Administered 2019-12-25 – 2019-12-26 (×4): 10 mg via ORAL
  Filled 2019-12-25 (×4): qty 2

## 2019-12-25 MED ORDER — THROMBIN 20000 UNITS EX SOLR
CUTANEOUS | Status: AC
  Filled 2019-12-25: qty 20000

## 2019-12-25 MED ORDER — PHENYLEPHRINE HCL-NACL 10-0.9 MG/250ML-% IV SOLN
INTRAVENOUS | Status: DC | PRN
  Administered 2019-12-25: 30 ug/min via INTRAVENOUS

## 2019-12-25 MED ORDER — ONDANSETRON HCL 4 MG/2ML IJ SOLN: 4.0000 mg | Freq: Four times a day (QID) | INTRAMUSCULAR | Status: DC | PRN

## 2019-12-25 MED ORDER — HYDROMORPHONE HCL 1 MG/ML IJ SOLN
0.2500 mg | INTRAMUSCULAR | Status: DC | PRN
  Administered 2019-12-25 (×2): 0.5 mg via INTRAVENOUS

## 2019-12-25 MED ORDER — MIDAZOLAM HCL 5 MG/5ML IJ SOLN
INTRAMUSCULAR | Status: DC | PRN
  Administered 2019-12-25: 2 mg via INTRAVENOUS

## 2019-12-25 MED ORDER — ACETAMINOPHEN 650 MG RE SUPP: 650.0000 mg | RECTAL | Status: DC | PRN

## 2019-12-25 MED ORDER — DEXAMETHASONE SODIUM PHOSPHATE 10 MG/ML IJ SOLN
INTRAMUSCULAR | Status: DC | PRN
  Administered 2019-12-25: 10 mg via INTRAVENOUS

## 2019-12-25 MED ORDER — EPHEDRINE 5 MG/ML INJ
INTRAVENOUS | Status: AC
  Filled 2019-12-25: qty 10

## 2019-12-25 MED ORDER — CHLORHEXIDINE GLUCONATE 0.12 % MT SOLN
15.0000 mL | Freq: Once | OROMUCOSAL | Status: AC
  Administered 2019-12-25: 15 mL via OROMUCOSAL

## 2019-12-25 MED ORDER — BUPIVACAINE HCL (PF) 0.5 % IJ SOLN
INTRAMUSCULAR | Status: AC
  Filled 2019-12-25: qty 30

## 2019-12-25 MED ORDER — PROPOFOL 10 MG/ML IV BOLUS
INTRAVENOUS | Status: DC | PRN
  Administered 2019-12-25: 20 mg via INTRAVENOUS
  Administered 2019-12-25: 180 mg via INTRAVENOUS

## 2019-12-25 MED ORDER — OXYCODONE HCL 5 MG PO TABS
ORAL_TABLET | ORAL | Status: AC
  Filled 2019-12-25: qty 2

## 2019-12-25 MED ORDER — FENTANYL CITRATE (PF) 250 MCG/5ML IJ SOLN
INTRAMUSCULAR | Status: AC
  Filled 2019-12-25: qty 5

## 2019-12-25 MED ORDER — LAMOTRIGINE 25 MG PO TABS
125.0000 mg | ORAL_TABLET | Freq: Every day | ORAL | Status: DC
  Administered 2019-12-25: 125 mg via ORAL
  Filled 2019-12-25: qty 1

## 2019-12-25 MED ORDER — SODIUM CHLORIDE 0.9% FLUSH
3.0000 mL | Freq: Two times a day (BID) | INTRAVENOUS | Status: DC
  Administered 2019-12-25 – 2019-12-26 (×2): 3 mL via INTRAVENOUS

## 2019-12-25 MED ORDER — EPHEDRINE SULFATE-NACL 50-0.9 MG/10ML-% IV SOSY
PREFILLED_SYRINGE | INTRAVENOUS | Status: DC | PRN
  Administered 2019-12-25: 5 mg via INTRAVENOUS

## 2019-12-25 MED ORDER — ONDANSETRON HCL 4 MG PO TABS: 4.0000 mg | ORAL_TABLET | Freq: Four times a day (QID) | ORAL | Status: DC | PRN

## 2019-12-25 MED ORDER — PROPOFOL 10 MG/ML IV BOLUS
INTRAVENOUS | Status: AC
  Filled 2019-12-25: qty 20

## 2019-12-25 MED ORDER — SUGAMMADEX SODIUM 200 MG/2ML IV SOLN
INTRAVENOUS | Status: DC | PRN
  Administered 2019-12-25: 300 mg via INTRAVENOUS

## 2019-12-25 MED ORDER — THROMBIN 5000 UNITS EX SOLR
CUTANEOUS | Status: AC
  Filled 2019-12-25: qty 5000

## 2019-12-25 MED ORDER — LACTATED RINGERS IV SOLN: INTRAVENOUS | Status: DC | PRN

## 2019-12-25 MED ORDER — PHENYLEPHRINE 40 MCG/ML (10ML) SYRINGE FOR IV PUSH (FOR BLOOD PRESSURE SUPPORT)
PREFILLED_SYRINGE | INTRAVENOUS | Status: AC
  Filled 2019-12-25: qty 10

## 2019-12-25 MED ORDER — VANCOMYCIN HCL IN DEXTROSE 1-5 GM/200ML-% IV SOLN
1000.0000 mg | INTRAVENOUS | Status: AC
  Administered 2019-12-25: 1000 mg via INTRAVENOUS
  Filled 2019-12-25: qty 200

## 2019-12-25 MED ORDER — HYDROMORPHONE HCL 1 MG/ML IJ SOLN
0.2500 mg | INTRAMUSCULAR | Status: DC | PRN
  Administered 2019-12-25 (×4): 0.5 mg via INTRAVENOUS

## 2019-12-25 SURGICAL SUPPLY — 79 items
ADH SKN CLS APL DERMABOND .7 (GAUZE/BANDAGES/DRESSINGS) ×1
APL SKNCLS STERI-STRIP NONHPOA (GAUZE/BANDAGES/DRESSINGS)
BAG DECANTER FOR FLEXI CONT (MISCELLANEOUS) ×2 IMPLANT
BAND INSRT 18 STRL LF DISP RB (MISCELLANEOUS) ×2
BAND RUBBER #18 3X1/16 STRL (MISCELLANEOUS) ×4 IMPLANT
BASKET BONE COLLECTION (BASKET) ×2 IMPLANT
BENZOIN TINCTURE PRP APPL 2/3 (GAUZE/BANDAGES/DRESSINGS) IMPLANT
BLADE CLIPPER SURG (BLADE) IMPLANT
BLADE SURG 11 STRL SS (BLADE) ×2 IMPLANT
BUR MATCHSTICK NEURO 3.0 LAGG (BURR) ×2 IMPLANT
BUR PRECISION FLUTE 5.0 (BURR) ×2 IMPLANT
CANISTER SUCT 3000ML PPV (MISCELLANEOUS) ×2 IMPLANT
CNTNR URN SCR LID CUP LEK RST (MISCELLANEOUS) ×1 IMPLANT
CONT SPEC 4OZ STRL OR WHT (MISCELLANEOUS)
COVER BACK TABLE 60X90IN (DRAPES) ×2 IMPLANT
COVER WAND RF STERILE (DRAPES) ×2 IMPLANT
DECANTER SPIKE VIAL GLASS SM (MISCELLANEOUS) ×2 IMPLANT
DERMABOND ADVANCED (GAUZE/BANDAGES/DRESSINGS) ×1
DERMABOND ADVANCED .7 DNX12 (GAUZE/BANDAGES/DRESSINGS) ×1 IMPLANT
DEVICE INTERBODY ELEVATE 23X7 (Cage) ×1 IMPLANT
DRAPE C-ARM 42X72 X-RAY (DRAPES) ×1 IMPLANT
DRAPE C-ARMOR (DRAPES) ×1 IMPLANT
DRAPE LAPAROTOMY 100X72X124 (DRAPES) ×2 IMPLANT
DRAPE MICROSCOPE LEICA (MISCELLANEOUS) ×2 IMPLANT
DRAPE SURG 17X23 STRL (DRAPES) ×2 IMPLANT
DURAPREP 26ML APPLICATOR (WOUND CARE) ×2 IMPLANT
ELECT BLADE 4.0 EZ CLEAN MEGAD (MISCELLANEOUS) ×2
ELECT REM PT RETURN 9FT ADLT (ELECTROSURGICAL) ×2
ELECTRODE BLDE 4.0 EZ CLN MEGD (MISCELLANEOUS) IMPLANT
ELECTRODE REM PT RTRN 9FT ADLT (ELECTROSURGICAL) ×1 IMPLANT
GAUZE 4X4 16PLY RFD (DISPOSABLE) IMPLANT
GAUZE SPONGE 4X4 12PLY STRL (GAUZE/BANDAGES/DRESSINGS) IMPLANT
GLOVE BIO SURGEON STRL SZ7.5 (GLOVE) ×4 IMPLANT
GLOVE BIOGEL PI IND STRL 7.5 (GLOVE) ×2 IMPLANT
GLOVE BIOGEL PI INDICATOR 7.5 (GLOVE) ×2
GLOVE ECLIPSE 7.5 STRL STRAW (GLOVE) ×3 IMPLANT
GLOVE EXAM NITRILE LRG STRL (GLOVE) IMPLANT
GLOVE EXAM NITRILE XL STR (GLOVE) IMPLANT
GLOVE EXAM NITRILE XS STR PU (GLOVE) IMPLANT
GLOVE SURG SS PI 7.5 STRL IVOR (GLOVE) ×2 IMPLANT
GLOVE SURG SS PI 8.0 STRL IVOR (GLOVE) ×1 IMPLANT
GOWN STRL REUS W/ TWL LRG LVL3 (GOWN DISPOSABLE) ×4 IMPLANT
GOWN STRL REUS W/ TWL XL LVL3 (GOWN DISPOSABLE) IMPLANT
GOWN STRL REUS W/TWL 2XL LVL3 (GOWN DISPOSABLE) IMPLANT
GOWN STRL REUS W/TWL LRG LVL3 (GOWN DISPOSABLE) ×2
GOWN STRL REUS W/TWL XL LVL3 (GOWN DISPOSABLE) ×2
GRAFT BN 5X1XSPNE CVD POST DBM (Bone Implant) IMPLANT
GRAFT BONE MAGNIFUSE 1X5CM (Bone Implant) ×2 IMPLANT
HEMOSTAT POWDER KIT SURGIFOAM (HEMOSTASIS) ×2 IMPLANT
KIT BASIN OR (CUSTOM PROCEDURE TRAY) ×2 IMPLANT
KIT POSITION SURG JACKSON T1 (MISCELLANEOUS) ×2 IMPLANT
KIT TURNOVER KIT B (KITS) ×2 IMPLANT
MILL MEDIUM DISP (BLADE) IMPLANT
NDL HYPO 18GX1.5 BLUNT FILL (NEEDLE) IMPLANT
NDL SPNL 18GX3.5 QUINCKE PK (NEEDLE) IMPLANT
NEEDLE HYPO 18GX1.5 BLUNT FILL (NEEDLE) IMPLANT
NEEDLE HYPO 22GX1.5 SAFETY (NEEDLE) ×2 IMPLANT
NEEDLE SPNL 18GX3.5 QUINCKE PK (NEEDLE) ×2 IMPLANT
NS IRRIG 1000ML POUR BTL (IV SOLUTION) ×2 IMPLANT
PACK LAMINECTOMY NEURO (CUSTOM PROCEDURE TRAY) ×2 IMPLANT
PAD ARMBOARD 7.5X6 YLW CONV (MISCELLANEOUS) ×3 IMPLANT
ROD SPINAL 5.5X40 COBALT NS (Rod) ×2 IMPLANT
SCREW SET SOLERA (Screw) ×8 IMPLANT
SCREW SET SOLERA TI5.5 (Screw) IMPLANT
SCREW SOLERA 45X6.5XMA NS SPNE (Screw) IMPLANT
SCREW SOLERA 6.5X35MM (Screw) ×2 IMPLANT
SCREW SOLERA 6.5X45MM (Screw) ×4 IMPLANT
SPONGE LAP 4X18 RFD (DISPOSABLE) IMPLANT
SPONGE SURGIFOAM ABS GEL 100 (HEMOSTASIS) IMPLANT
STRIP CLOSURE SKIN 1/2X4 (GAUZE/BANDAGES/DRESSINGS) IMPLANT
SUT MNCRL AB 3-0 PS2 18 (SUTURE) ×2 IMPLANT
SUT VIC AB 0 CT1 18XCR BRD8 (SUTURE) ×1 IMPLANT
SUT VIC AB 0 CT1 8-18 (SUTURE) ×2
SUT VIC AB 2-0 CP2 18 (SUTURE) ×3 IMPLANT
SYR 3ML LL SCALE MARK (SYRINGE) IMPLANT
TOWEL GREEN STERILE (TOWEL DISPOSABLE) ×2 IMPLANT
TOWEL GREEN STERILE FF (TOWEL DISPOSABLE) ×2 IMPLANT
TRAY FOLEY MTR SLVR 16FR STAT (SET/KITS/TRAYS/PACK) ×2 IMPLANT
WATER STERILE IRR 1000ML POUR (IV SOLUTION) ×2 IMPLANT

## 2019-12-25 NOTE — Transfer of Care (Signed)
Immediate Anesthesia Transfer of Care Note  Patient: Terri Patterson  Procedure(s) Performed: Left open Lumbar five-sacral one Transforaminal lumbar interbody fusion (Left )  Patient Location: PACU  Anesthesia Type:General  Level of Consciousness: awake, alert  and oriented  Airway & Oxygen Therapy: Patient Spontanous Breathing and Patient connected to nasal cannula oxygen  Post-op Assessment: Report given to RN and Post -op Vital signs reviewed and stable  Post vital signs: Reviewed and stable  Last Vitals:  Vitals Value Taken Time  BP 117/86 12/25/19 1645  Temp    Pulse 105 12/25/19 1647  Resp 20 12/25/19 1647  SpO2 98 % 12/25/19 1647  Vitals shown include unvalidated device data.  Last Pain:  Vitals:   12/25/19 0938  PainSc: 7       Patients Stated Pain Goal: 3 (12/25/19 6922)  Complications: No apparent anesthesia complications

## 2019-12-25 NOTE — Progress Notes (Signed)
Orthopedic Tech Progress Note Patient Details:  Terri Patterson [birth year] [id number] Fitted patient for back brace Patient ID: Terri Patterson, female   DOB: 10-Feb-1981, 39 y.o.   MRN: [id number]   [name] 12/25/2019, 7:28 PM

## 2019-12-25 NOTE — Anesthesia Postprocedure Evaluation (Signed)
Anesthesia Post Note  Patient: Terri Patterson  Procedure(s) Performed: Left open Lumbar five-sacral one Transforaminal lumbar interbody fusion (Left )     Patient location during evaluation: PACU Anesthesia Type: General Level of consciousness: awake and alert Pain management: pain level controlled Vital Signs Assessment: post-procedure vital signs reviewed and stable Respiratory status: spontaneous breathing, nonlabored ventilation, respiratory function stable and patient connected to nasal cannula oxygen Cardiovascular status: blood pressure returned to baseline and stable Postop Assessment: no apparent nausea or vomiting Anesthetic complications: no    Last Vitals:  Vitals:   12/25/19 1741 12/25/19 1755  BP: 126/63 114/64  Pulse: 92 93  Resp: 12 12  Temp:  36.4 C  SpO2: 93% 96%    Last Pain:  Vitals:   12/25/19 1755  PainSc: Asleep                 Trevor Iha

## 2019-12-25 NOTE — H&P (Signed)
Surgical H&P Update  HPI: 39 y.o. woman with a history of prior right 5-1 microdiscectomy that presented with severe pain and left EHL weakness, consistent with a left L5 radiculopathy. MRI showed severe left sided foraminal stenosis at L5-S1, I therefore recommended a left L5-S1 TLIF to perform a complete facetectomy and decompress the left L5 nerve root. No changes in health since she was last seen. Still having symptoms and wishes to proceed with surgery.  PMHx:  Past Medical History:  Diagnosis Date  . Anxiety   . Arthritis   . Bipolar disorder (HCC)   . Depression   . EDS (Ehlers-Danlos syndrome)   . Family history of adverse reaction to anesthesia    patient's father had anaphylaxis reaction from anesthesia and "swelled up like a bullfrog"  . GERD (gastroesophageal reflux disease)   . Headache    FamHx: History reviewed. No pertinent family history. SocHx:  reports that she has been smoking cigarettes. She has been smoking about 0.25 packs per day. She has never used smokeless tobacco. She reports previous alcohol use. She reports previous drug use.  Physical Exam: Strength 5/5 x4 except left tib ant 4/5 and EHL 3/5, SILTx4 except left L5 and S1 numbness  Assesment/Plan: 39 y.o. woman with left L5 radiculopathy with TA/EHL weakness 2/2 severe foraminal stenosis at L5-S1, here for left L5-S1 open TLIF. Risks, benefits, and alternatives discussed and the patient would like to continue with surgery.  -OR today -3C post-op  Jadene Pierini, MD 12/25/19 10:02 AM

## 2019-12-25 NOTE — Op Note (Signed)
PATIENT: Terri Patterson  DAY OF SURGERY: 12/25/19   PRE-OPERATIVE DIAGNOSIS:  Lumbar radiculopathy   POST-OPERATIVE DIAGNOSIS:  Lumbar radiculopathy   PROCEDURE:  Left L5-S1 open TLIF, bilateral pedicle screw placement with posterolateral fusion   SURGEON:  Surgeon(s) and Role:    Jadene Pierini, MD - Primary   ANESTHESIA: ETGA   BRIEF HISTORY: This is a 39 year old who presented after a prior right L5-S1 discectomy with severe left sided foraminal stenosis and left sided radicular pain with foot weakness with numbness. This was discussed with the patient as well as risks, benefits, and alternatives and wished to proceed with surgery.   OPERATIVE DETAIL: The patient was taken to the operating room and anesthesia was induced by the anesthesia team. They were placed on the OR table in the prone position with padding of all pressure points. A formal time out was performed with two patient identifiers and confirmed the operative site. The operative site was marked, hair was clipped with surgical clippers, the area was then prepped and draped in a sterile fashion. The prior incision was opened in the midline. Subperiosteal dissection was performed bilaterally and fluoroscopy was again used to confirm the surgical level.   There was a laminectomy defect on the right, which was dissected free without issue. Decompression was performed, which consisted of a left hemilaminectomy and complete facetectomy. The left L5 nerve root was decompressed along its course. The disc was identified, confirmed with fluoroscopy, then an annulotomy was performed followed by a discectomy. While protecting the thecal sac and nerve roots, an expandable interbody cage (Medtronic) was inserted using fluoroscopy. The graft was packed with bone and the disc space was packed with bone graft.   Pedicle instrumentation was then performed. Fluoroscopy was used to guide placement of bilateral pedicle screws (Medtronic) at L5  and S1. These were placed by localizing the pedicle with standard landmarks, drilling a pilot hole, cannulating the pedicle with an awl, palpating for pedicle wall breaches, tapping, palpating, and then placing the screw. These were connected with rods bilaterally and final tightened according to manufacturer torque specifications. The bone was thoroughly decorticated over the posterolateral fusion surface and the previously resected bone fragments were morselized and used as autograft with the addition of magnafuse bone graft bilaterally (Medtronic).   All instrument and sponge counts were correct, the incision was then closed in layers. The patient was then returned to anesthesia for emergence. No apparent complications at the completion of the procedure.   EBL:    DRAINS: none   SPECIMENS: none   Jadene Pierini, MD 12/25/19 4:25 PM

## 2019-12-25 NOTE — Anesthesia Procedure Notes (Signed)
Procedure Name: Intubation Date/Time: 12/25/2019 12:39 PM Performed by: Nils Pyle, CRNA Pre-anesthesia Checklist: Patient identified, Emergency Drugs available, Suction available and Patient being monitored Patient Re-evaluated:Patient Re-evaluated prior to induction Oxygen Delivery Method: Circle System Utilized Preoxygenation: Pre-oxygenation with 100% oxygen Induction Type: IV induction Ventilation: Mask ventilation without difficulty Laryngoscope Size: Miller and 2 Grade View: Grade I Tube type: Oral Tube size: 7.0 mm Number of attempts: 1 Airway Equipment and Method: Stylet and Oral airway Placement Confirmation: ETT inserted through vocal cords under direct vision,  positive ETCO2 and breath sounds checked- equal and bilateral Secured at: 22 cm Tube secured with: Tape Dental Injury: Teeth and Oropharynx as per pre-operative assessment

## 2019-12-25 NOTE — Brief Op Note (Signed)
12/25/2019  4:24 PM  PATIENT:  Norva Riffle  39 y.o. female  PRE-OPERATIVE DIAGNOSIS:  Lumbar radiculopathy  POST-OPERATIVE DIAGNOSIS:  Lumbar radiculopathy  PROCEDURE:  Procedure(s) with comments: Left open Lumbar five-sacral one Transforaminal lumbar interbody fusion (Left) - 3C  SURGEON:  Surgeon(s) and Role:    * Jadene Pierini, MD - Primary  PHYSICIAN ASSISTANT:   ASSISTANTS: none   ANESTHESIA:   general  EBL:  250 mL   BLOOD ADMINISTERED:none  DRAINS: none   LOCAL MEDICATIONS USED:  LIDOCAINE   SPECIMEN:  No Specimen  DISPOSITION OF SPECIMEN:  N/A  COUNTS:  YES  TOURNIQUET:  * No tourniquets in log *  DICTATION: .Note written in EPIC  PLAN OF CARE: Admit to inpatient   PATIENT DISPOSITION:  PACU - hemodynamically stable.   Delay start of Pharmacological VTE agent (>24hrs) due to surgical blood loss or risk of bleeding: yes

## 2019-12-25 NOTE — Progress Notes (Signed)
Pt is s/p bilateral pedicle screw placement with posterolateral fusion. Vanc was ordered x 2 doses post op. She got her pre-op dose at 0927 this AM.   Scr <1  Vanc 1g IV q12 x2 Rx signs off  Ulyses Southward, PharmD, [place], AAHIVP, CPP Infectious Disease Pharmacist 12/25/2019 7:08 PM

## 2019-12-25 NOTE — Anesthesia Preprocedure Evaluation (Signed)
Anesthesia Evaluation  Patient identified by MRN, date of birth, ID band Patient awake    Reviewed: Allergy & Precautions, NPO status , Patient's Chart, lab work & pertinent test results  Airway Mallampati: II  TM Distance: >3 FB Neck ROM: Full    Dental  (+) Dental Advisory Given   Pulmonary Current Smoker,    breath sounds clear to auscultation       Cardiovascular negative cardio ROS   Rhythm:Regular Rate:Normal     Neuro/Psych  Headaches, Anxiety Depression Bipolar Disorder    GI/Hepatic Neg liver ROS, GERD  ,  Endo/Other  negative endocrine ROS  Renal/GU negative Renal ROS     Musculoskeletal  (+) Arthritis ,   Abdominal   Peds  Hematology negative hematology ROS (+)   Anesthesia Other Findings   Reproductive/Obstetrics                             Lab Results  Component Value Date   WBC 7.7 12/21/2019   HGB 12.1 12/21/2019   HCT 39.3 12/21/2019   MCV 91.4 12/21/2019   PLT 284 12/21/2019   Lab Results  Component Value Date   CREATININE 0.50 12/21/2019   BUN 8 12/21/2019   NA 142 12/21/2019   K 4.1 12/21/2019   CL 110 12/21/2019   CO2 25 12/21/2019    Anesthesia Physical Anesthesia Plan  ASA: II  Anesthesia Plan: General   Post-op Pain Management:    Induction: Intravenous  PONV Risk Score and Plan: 2 and Ondansetron, Dexamethasone and Treatment may vary due to age or medical condition  Airway Management Planned: Oral ETT  Additional Equipment: None  Intra-op Plan:   Post-operative Plan: Extubation in OR  Informed Consent: I have reviewed the patients History and Physical, chart, labs and discussed the procedure including the risks, benefits and alternatives for the proposed anesthesia with the patient or authorized representative who has indicated his/her understanding and acceptance.     Dental advisory given  Plan Discussed with: CRNA  Anesthesia  Plan Comments:         Anesthesia Quick Evaluation

## 2019-12-26 ENCOUNTER — Encounter: Payer: Self-pay | Admitting: *Deleted

## 2019-12-26 MED ORDER — OXYCODONE-ACETAMINOPHEN 10-325 MG PO TABS: 1.0000 | ORAL_TABLET | ORAL | 0 refills | Status: AC | PRN

## 2019-12-26 NOTE — Plan of Care (Signed)
Patient alert and oriented, mae's well, voiding adequate amount of urine, swallowing without difficulty, no c/o pain at time of discharge. Patient discharged home with family. Script and discharged instructions given to patient. Patient and family stated understanding of instructions given. Patient has an appointment with Dr. Ostergard   

## 2019-12-26 NOTE — Evaluation (Addendum)
Occupational Therapy Evaluation Patient Details Name: Terri Patterson MRN: [id number] DOB: 11-Aug-1980 Today's Date: 12/26/2019    History of Present Illness Pt is a 39 y/o female s/p TLIF L5-S1. PMH including but not limited to Ehlers-Danlos, PTSD, Bipolar disorder.   Clinical Impression   PTA, pt living with her fiance and was independent with ADL but would use either a w/c or crawl around her home for functional mobility. Pt asleep upon arrival, pt requesting to keep session bed level secondary to fatigue and pain. Pt worked with PT prior to OT arrival. Session focused on thorough education regarding ADL and adherence to precautions. Pt demonstrated ability to roll in bed with supervision. She demonstrated ability to figure-4 BLE. Thorough verbal discussion with PT, who reported pt required supervision with functional mobility at RW level. Pt with anticipated d/c this date. Patient evaluated by Occupational Therapy with no further acute OT needs identified. All education has been completed and the patient has no further questions. See below for any follow-up Occupational Therapy or equipment needs. OT to sign off. Thank you for referral.      Follow Up Recommendations  No OT follow up    Equipment Recommendations  3 in 1 bedside commode    Recommendations for Other Services       Precautions / Restrictions Precautions Precautions: Back Precaution Comments: reviewed 3/3 back precautions with pt throughout Required Braces or Orthoses: Spinal Brace Spinal Brace: Lumbar corset;Applied in sitting position;Other (comment) Spinal Brace Comments: "no brace needed" MD order; brace present during eval Restrictions Weight Bearing Restrictions: No      Mobility Bed Mobility Overal bed mobility: Needs Assistance Bed Mobility: Rolling Rolling: Supervision Sidelying to sit: Supervision     Sit to sidelying: Supervision General bed mobility comments: pt declined OOB this session;per  discussion with PT pt is at supervision level for bed mobility  Transfers Overall transfer level: Needs assistance Equipment used: Rolling walker (2 wheeled) Transfers: Sit to/from Stand Sit to Stand: Supervision         General transfer comment: pt declined OOB this session;per PT pt requires supervision at RW level    Balance Overall balance assessment: Mild deficits observed, not formally tested Sitting-balance support: Feet supported Sitting balance-Leahy Scale: Good     Standing balance support: During functional activity;Bilateral upper extremity supported;Single extremity supported Standing balance-Leahy Scale: Poor                             ADL either performed or assessed with clinical judgement   ADL Overall ADL's : Needs assistance/impaired                                     Functional mobility during ADLs: Supervision/safety;Rolling walker General ADL Comments: pt requested for bed level session this date due to fatigue from PT session (which was immediately before our session);educated pt on completion of ADL with compensatory strategies to adhere to back precautions;pt demonstrated ability to figure-4 while supine in bed;she demonstrated ability to complete rolling with superivison;educated pt on completion of grooming, bathing, etc while adhering to precautions.      Vision         Perception     Praxis      Pertinent Vitals/Pain Pain Assessment: Faces Faces Pain Scale: Hurts whole lot Pain Location: R LE, R hip and back Pain Descriptors /  Indicators: Grimacing;Guarding Pain Intervention(s): Monitored during session;Limited activity within patient's tolerance     Hand Dominance Right   Extremity/Trunk Assessment Upper Extremity Assessment Upper Extremity Assessment: Overall WFL for tasks assessed   Lower Extremity Assessment Lower Extremity Assessment: Defer to PT evaluation   Cervical / Trunk Assessment Cervical  / Trunk Assessment: Other exceptions Cervical / Trunk Exceptions: s/p lumbar sx   Communication Communication Communication: No difficulties   Cognition Arousal/Alertness: Awake/alert Behavior During Therapy: WFL for tasks assessed/performed Overall Cognitive Status: Within Functional Limits for tasks assessed                                 General Comments: pt required assistance to recall 3/3 back precautions but able to problem solve through hypothetical scenarios   General Comments       Exercises     Shoulder Instructions      Home Living Family/patient expects to be discharged to:: Private residence Living Arrangements: Spouse/significant other Available Help at Discharge: Family Type of Home: Other(Comment)(Townhouse) Home Access: Level entry     Home Layout: Two level Alternate Level Stairs-Number of Steps: flight Alternate Level Stairs-Rails: Left Bathroom Shower/Tub: Teacher, early years/pre: Standard     Home Equipment: Wheelchair - manual          Prior Functioning/Environment Level of Independence: Independent with assistive device(s)        Comments: pt was using a w/c for community distances; reported she has been crawling around her home when the pain was too severe        OT Problem List: Decreased knowledge of precautions;Pain      OT Treatment/Interventions:      OT Goals(Current goals can be found in the care plan section) Acute Rehab OT Goals Patient Stated Goal: decrease pain OT Goal Formulation: With patient Time For Goal Achievement: 01/02/20 Potential to Achieve Goals: Good  OT Frequency:     Barriers to D/C:            Co-evaluation              AM-PAC OT "6 Clicks" Daily Activity     Outcome Measure Help from another person eating meals?: None Help from another person taking care of personal grooming?: A Little Help from another person toileting, which includes using toliet, bedpan, or  urinal?: A Little Help from another person bathing (including washing, rinsing, drying)?: A Little Help from another person to put on and taking off regular upper body clothing?: A Little Help from another person to put on and taking off regular lower body clothing?: A Little 6 Click Score: 19   End of Session Nurse Communication: Mobility status  Activity Tolerance: Patient limited by fatigue;Patient limited by pain Patient left: in bed;with call bell/phone within reach  OT Visit Diagnosis: Other abnormalities of gait and mobility (R26.89);Pain Pain - part of body: (back incision site)                Time: 6606-3016 OT Time Calculation (min): 11 min Charges:  OT General Charges $OT Visit: 1 Visit OT Evaluation $OT Eval Low Complexity: [place] OTR/L Acute Rehabilitation Services Office: [place] 12/26/2019, 9:58 AM

## 2019-12-26 NOTE — Discharge Instructions (Addendum)
Discharge Instruction Wound Care Remove the outer dressing in 2 days Leave incision open to air. You may shower. Do not scrub directly on incision.  Do not put any creams, lotions, or ointments on incision. The glue will naturally fall off over the next 1-2 weeks. Activity Walk each and every day, increasing distance each day. No lifting greater than 5 lbs.  Avoid bending, arching, and twisting. No driving for 2 weeks; may ride as a passenger locally. If provided with back brace, wear when out of bed.  It is not necessary to wear in bed. Diet Resume your normal diet.  Return to Work Will be discussed at you follow up appointment. Call Your Doctor If Any of These Occur Redness, drainage, or swelling at the wound.  Temperature greater than 101 degrees. Severe pain not relieved by pain medication. Incision starts to come apart. Follow Up Appt Follow up with Dr. Maurice Small in 2 weeks after discharge. If you do not already have a discharge appointment, please call his office at [phone] to schedule a follow up appointment. If you have any concerns or questions, please call the office and let us know.

## 2019-12-26 NOTE — Progress Notes (Signed)
Pt has been refusing ambulation when offered.  Pt ambulates well without assistive device to the bathroom.

## 2019-12-26 NOTE — Evaluation (Signed)
Physical Therapy Evaluation Patient Details Name: Terri Patterson MRN: [id number] DOB: 11-28-1980 Today's Date: 12/26/2019   History of Present Illness  Pt is a 39 y/o female s/p TLIF L5-S1. PMH including but not limited to Ehlers-Danlos, PTSD, Bipolar disorder.  Clinical Impression  Pt presented supine in bed with HOB elevated, awake and willing to participate in therapy session. Prior to admission, pt reported that she was using a w/c for community level distances and would sometimes crawl around her home when the pain was too severe. Pt lives with her fiance in a two-level townhouse with a level entrance. At the time of evaluation, pt overall at a supervision to min guard level with all functional mobility including gait training in hallway with use of RW. Pt participated in stair training this session as well without difficulties. PT provided pt education re: back precautions, donning/doffing and wear schedule of LSO and a generalized walking program for pt to initiate upon d/c home. Pt expressed understanding. Plan is to d/c home today with family support. PT will continue to f/u with pt acutely to progress mobility as tolerated per PT POC.    Follow Up Recommendations No PT follow up    Equipment Recommendations  Rolling walker with 5" wheels;3in1 (PT)    Recommendations for Other Services       Precautions / Restrictions Precautions Precautions: Back Precaution Comments: reviewed 3/3 back precautions with pt throughout Required Braces or Orthoses: Spinal Brace Spinal Brace: Lumbar corset;Applied in sitting position;Other (comment) Spinal Brace Comments: "no brace needed" MD order; brace present during eval Restrictions Weight Bearing Restrictions: No      Mobility  Bed Mobility Overal bed mobility: Needs Assistance Bed Mobility: Rolling;Sidelying to Sit;Sit to Sidelying Rolling: Supervision Sidelying to sit: Supervision     Sit to sidelying: Supervision General bed  mobility comments: good utilization of log roll technique, use of bed rails, HOB flat  Transfers Overall transfer level: Needs assistance Equipment used: Rolling walker (2 wheeled) Transfers: Sit to/from Stand Sit to Stand: Supervision         General transfer comment: for safety  Ambulation/Gait Ambulation/Gait assistance: Min guard;Supervision Gait Distance (Feet): 500 Feet Assistive device: Rolling walker (2 wheeled) Gait Pattern/deviations: Step-through pattern;Decreased step length - right;Decreased step length - left;Decreased stride length Gait velocity: decreased   General Gait Details: pt with slow, cautious and guarded gait with RW; no LOB or need for physical assistance; towards end of ambulation pt reporting that her R LE was "giving out" but no knee buckling or instability noted  Stairs Stairs: Yes Stairs assistance: Min guard Stair Management: One rail Left;Step to pattern;Forwards Number of Stairs: 10 General stair comments: cueing for technique, min guard for safety  Wheelchair Mobility    Modified Rankin (Stroke Patients Only)       Balance Overall balance assessment: Needs assistance Sitting-balance support: Feet supported Sitting balance-Leahy Scale: Good     Standing balance support: During functional activity;Bilateral upper extremity supported;Single extremity supported Standing balance-Leahy Scale: Poor                               Pertinent Vitals/Pain Pain Assessment: Faces Faces Pain Scale: Hurts whole lot Pain Location: R LE, R hip and back Pain Descriptors / Indicators: Grimacing;Guarding Pain Intervention(s): Monitored during session;Repositioned    Home Living Family/patient expects to be discharged to:: Private residence Living Arrangements: Spouse/significant other Available Help at Discharge: Family Type of Home: Other(Comment)(Townhouse)  Home Access: Level entry     Home Layout: Two level Home Equipment:  Wheelchair - manual      Prior Function Level of Independence: Independent with assistive device(s)         Comments: pt was using a w/c for community distances; reported she has been crawling around her home when the pain was too severe     Hand Dominance        Extremity/Trunk Assessment   Upper Extremity Assessment Upper Extremity Assessment: Overall WFL for tasks assessed    Lower Extremity Assessment Lower Extremity Assessment: Overall WFL for tasks assessed    Cervical / Trunk Assessment Cervical / Trunk Assessment: Other exceptions Cervical / Trunk Exceptions: s/p lumbar sx  Communication   Communication: No difficulties  Cognition Arousal/Alertness: Awake/alert Behavior During Therapy: WFL for tasks assessed/performed Overall Cognitive Status: Within Functional Limits for tasks assessed                                        General Comments      Exercises     Assessment/Plan    PT Assessment Patient needs continued PT services  PT Problem List Decreased strength;Decreased range of motion;Decreased activity tolerance;Decreased balance;Decreased mobility;Decreased knowledge of use of DME;Decreased safety awareness;Decreased knowledge of precautions;Pain       PT Treatment Interventions DME instruction;Gait training;Stair training;Therapeutic activities;Functional mobility training;Balance training;Therapeutic exercise;Neuromuscular re-education;Patient/family education    PT Goals (Current goals can be found in the Care Plan section)  Acute Rehab PT Goals Patient Stated Goal: decrease pain PT Goal Formulation: With patient Time For Goal Achievement: 01/09/20 Potential to Achieve Goals: Good    Frequency Min 5X/week   Barriers to discharge        Co-evaluation               AM-PAC PT "6 Clicks" Mobility  Outcome Measure Help needed turning from your back to your side while in a flat bed without using bedrails?:  None Help needed moving from lying on your back to sitting on the side of a flat bed without using bedrails?: None Help needed moving to and from a bed to a chair (including a wheelchair)?: None Help needed standing up from a chair using your arms (e.g., wheelchair or bedside chair)?: None Help needed to walk in hospital room?: None Help needed climbing 3-5 steps with a railing? : A Little 6 Click Score: 23    End of Session Equipment Utilized During Treatment: Back brace Activity Tolerance: Patient tolerated treatment well Patient left: in bed;with call bell/phone within reach Nurse Communication: Mobility status PT Visit Diagnosis: Other abnormalities of gait and mobility (R26.89);Pain Pain - Right/Left: Right Pain - part of body: Leg;Hip    Time: 0833-0909 PT Time Calculation (min) (ACUTE ONLY): 36 min   Charges:   PT Evaluation $PT Eval Moderate Complexity: 1 Mod PT Treatments $Gait Training: 8-22 mins        Arletta Bale, DPT  Acute Rehabilitation Services Pager [phone] Office [phone]    Alessandra Bevels Yahaira Bruski 12/26/2019, 9:31 AM

## 2019-12-26 NOTE — Discharge Summary (Signed)
Discharge Summary  Date of Admission: 12/25/2019  Date of Discharge: 12/26/19  Attending Physician: Autumn Patty, MD  Hospital Course: Patient was admitted following an uncomplicated open left L5-S1 TLIF. She was recovered in PACU and transferred to [facility]. Her preop radicular pain resolved immediately post op and her strength was actually improved from preop. She had some recurrence of her prior L5 symptoms ont he right immediately post-op that began to improve quickly and were almost resolved by POD1. Her hospital course was uncomplicated and the patient was discharged home on 12/26/19. She will follow up in clinic with me in 2 weeks.  Neurologic exam at discharge:  AOx3, PERRL, EOMI, FS, TM Strength 5/5 x4 except 4+/5 in L EHL/TA, SILT except bilateral L5 numbness   Discharge diagnosis: Lumbar radiculopathy  Jadene Pierini, MD 12/26/19 8:48 AM

## 2020-01-21 ENCOUNTER — Other Ambulatory Visit: Payer: Self-pay | Admitting: Neurological Surgery

## 2020-01-21 DIAGNOSIS — M5416 Radiculopathy, lumbar region: Secondary | ICD-10-CM

## 2020-02-05 ENCOUNTER — Ambulatory Visit
Admission: RE | Admit: 2020-02-05 | Discharge: 2020-02-05 | Disposition: A | Discharge status: Home / Self Care | Source: Ambulatory Visit | Attending: Neurological Surgery | Admitting: Neurological Surgery

## 2020-02-05 DIAGNOSIS — M5416 Radiculopathy, lumbar region: Secondary | ICD-10-CM

## 2020-04-25 DIAGNOSIS — J9601 Acute respiratory failure with hypoxia: Secondary | ICD-10-CM

## 2020-04-29 DIAGNOSIS — I517 Cardiomegaly: Secondary | ICD-10-CM

## 2020-05-26 DEATH — deceased

## 2021-05-16 IMAGING — CT CT HIP*L* W/O CM
2 of 4 series · 17 of 46 positions shown, 19 images · non-contrast
Comparison: Left hip radiograph dated 11/18/2019.

CLINICAL DATA: 38-year-old female with fall and left hip pain.
Concern for fracture.

EXAM:
CT OF THE LEFT HIP WITHOUT CONTRAST
TECHNIQUE: Multidetector CT imaging of the left hip was performed according to
the standard protocol. Multiplanar CT image reconstructions were
also generated.

[Series 5: soft tissue (person_name) · axial · 0.61mm/px · z∈[+756,+1054]mm · 14 of 173 slices shown, 16 images]
[im 12/173  soft-tissue]
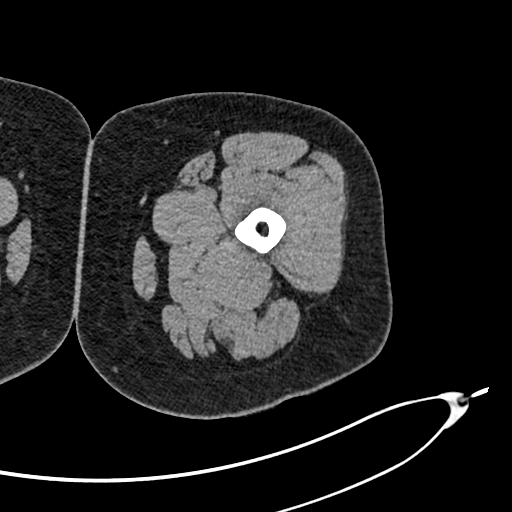
[im 12/173  bone]
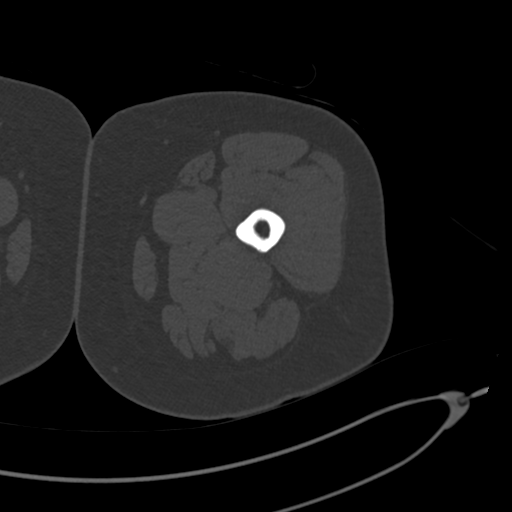
[im 23/173  soft-tissue]
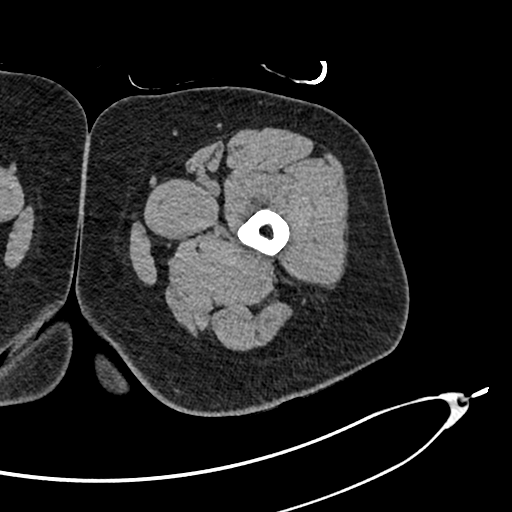
[im 34/173  soft-tissue]
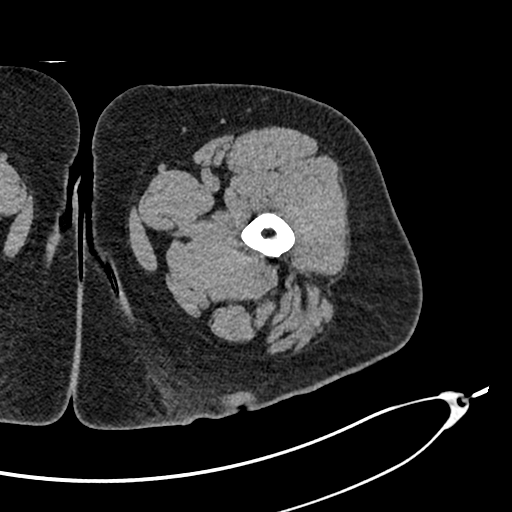
[im 45/173  soft-tissue]
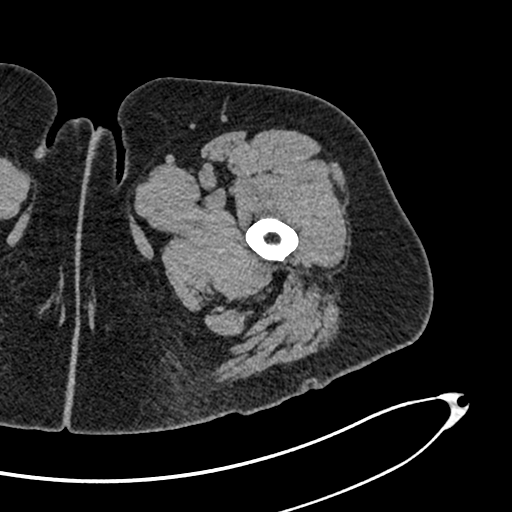
[im 56/173  soft-tissue]
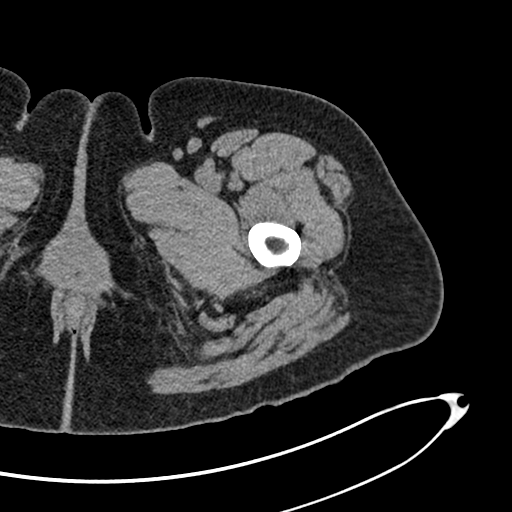
[im 67/173  soft-tissue]
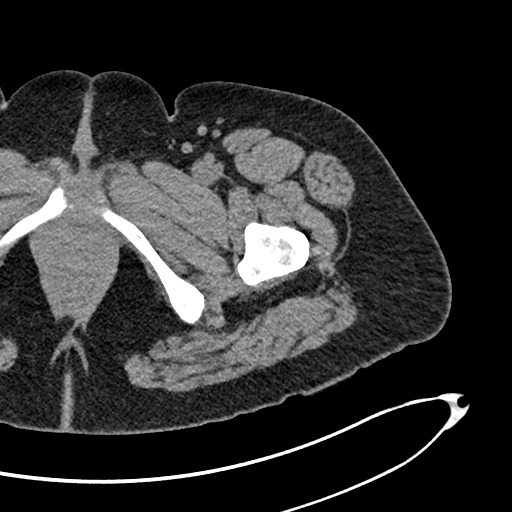
[im 78/173  soft-tissue]
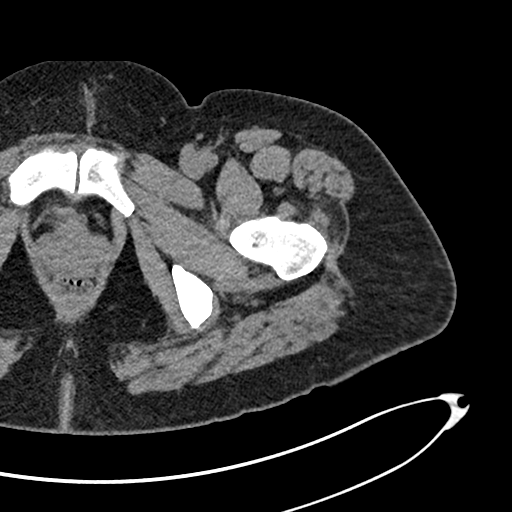
[im 95/173  soft-tissue]
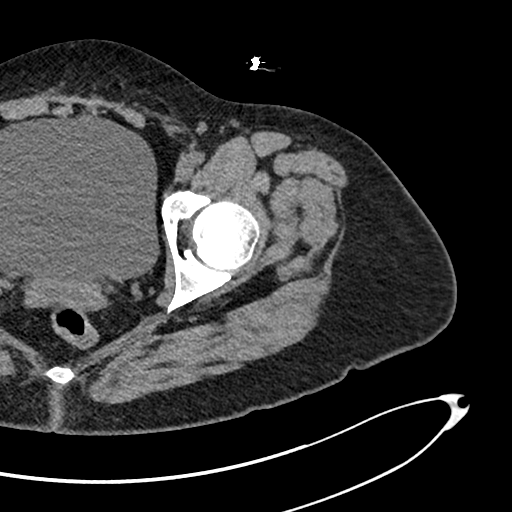
[im 106/173  soft-tissue]
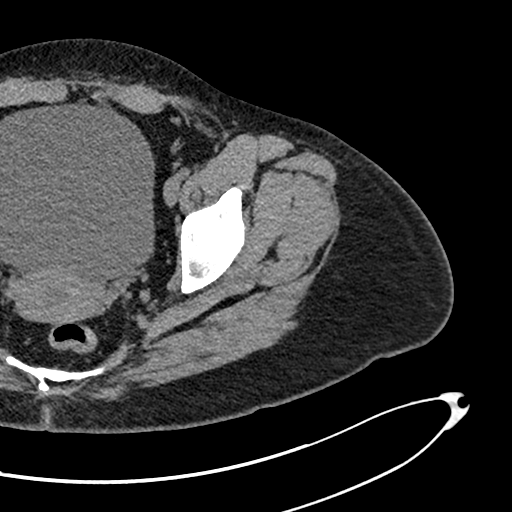
[im 106/173  bone]
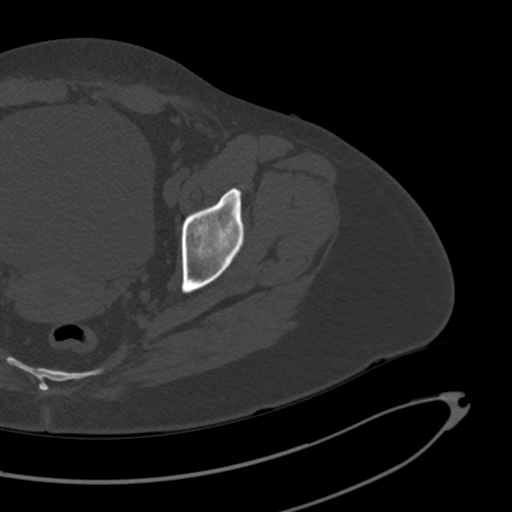
[im 117/173  soft-tissue]
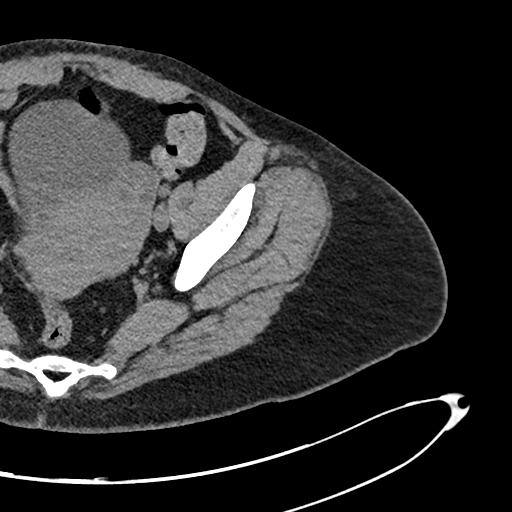
[im 128/173  soft-tissue]
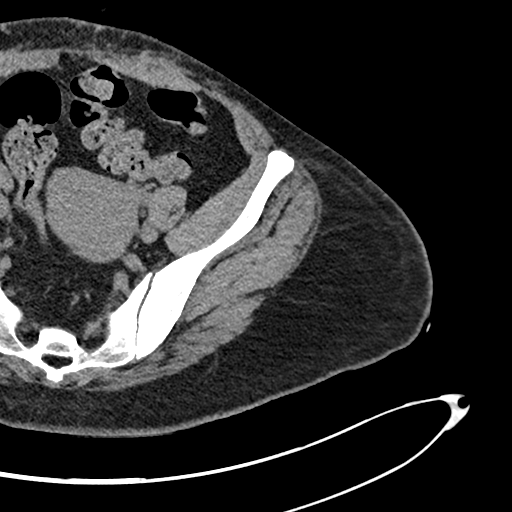
[im 139/173  soft-tissue]
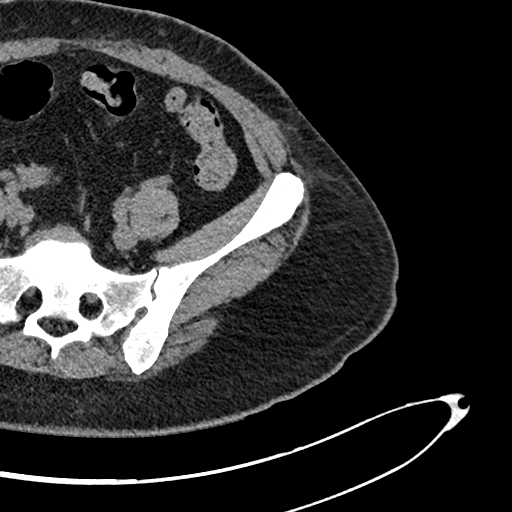
[im 150/173  soft-tissue]
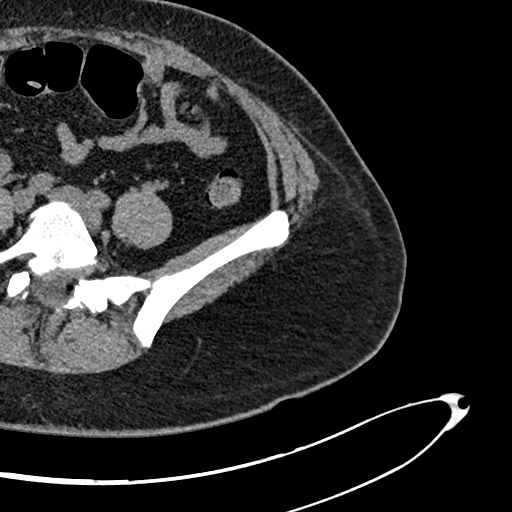
[im 161/173  soft-tissue]
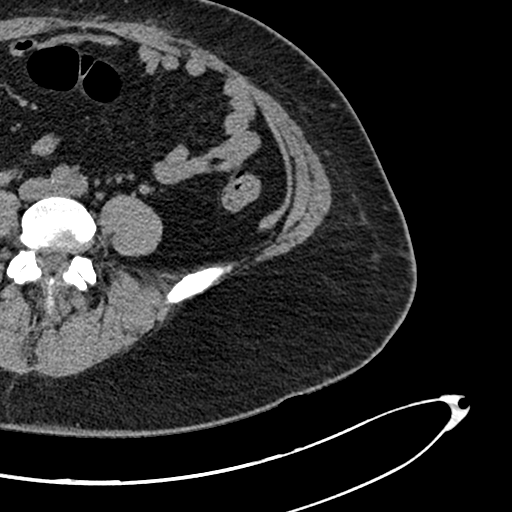

[Series 6: cor soft · coronal · 0.56mm/px · 3 of 123 slices shown]
[im 31/123  soft-tissue]
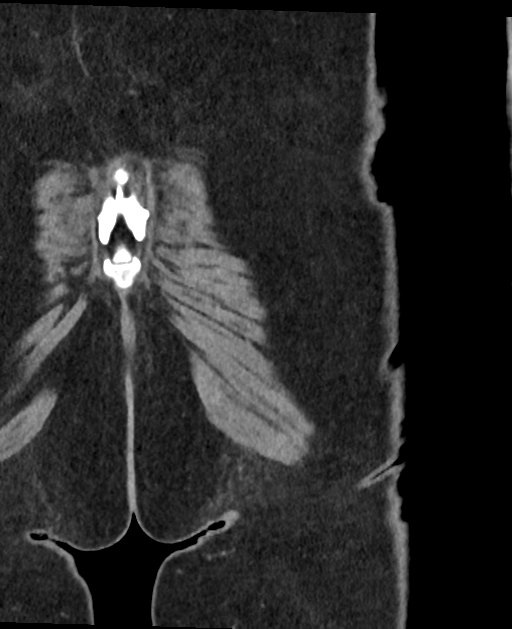
[im 62/123  soft-tissue]
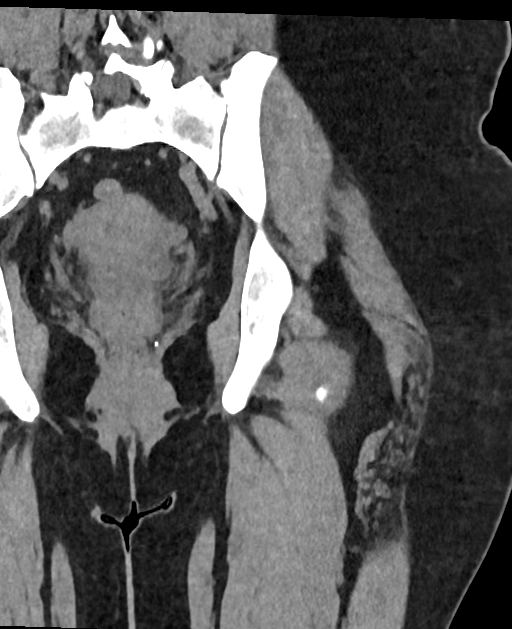
[im 92/123  soft-tissue]
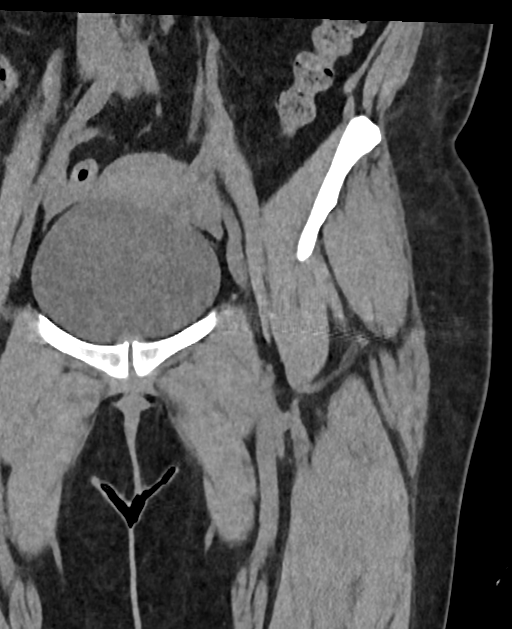

[17 of 46 positions shown; findings below may reference images not displayed]

FINDINGS: Bones/Joint/Cartilage

There is no acute fracture or dislocation. The bones are well
mineralized parental no arthritic changes. No joint effusion.

Ligaments

Suboptimally assessed by CT.

Muscles and Tendons

No acute findings. No fluid collection or hematoma.

Soft tissues

The visualized soft tissues are grossly unremarkable.
IMPRESSION: No acute/traumatic left hip pathology.
